# Patient Record
Sex: Female | Born: 1975 | Race: Black or African American | Hispanic: No | Marital: Married | Smoking: Never smoker
Health system: Southern US, Community
[De-identification: ages and names within clinical notes are randomized; demographics above are authoritative.]

## PROBLEM LIST (undated history)

## (undated) DIAGNOSIS — D219 Benign neoplasm of connective and other soft tissue, unspecified: Secondary | ICD-10-CM

## (undated) DIAGNOSIS — D649 Anemia, unspecified: Secondary | ICD-10-CM

## (undated) DIAGNOSIS — Z6841 Body Mass Index (BMI) 40.0 and over, adult: Secondary | ICD-10-CM

## (undated) HISTORY — PX: TUBAL LIGATION: SHX77

---

## 2019-05-25 ENCOUNTER — Other Ambulatory Visit: Payer: Self-pay

## 2019-05-25 ENCOUNTER — Inpatient Hospital Stay (HOSPITAL_COMMUNITY)
Admission: AD | Admit: 2019-05-25 | Discharge: 2019-05-25 | Disposition: A | Discharge status: Home / Self Care | Payer: Self-pay | Attending: Obstetrics and Gynecology | Admitting: Obstetrics and Gynecology

## 2019-05-25 DIAGNOSIS — N912 Amenorrhea, unspecified: Secondary | ICD-10-CM | POA: Insufficient documentation

## 2019-05-25 NOTE — MAU Note (Addendum)
No period since November 2019. Have had 2 home pregnancy tests that are negative." I feel some movement in there and my stomach is getting bigger" Denies vag bleeding, d/c or pain. Just worried about ectopic pregnancy which had about 79yrs ago. Had BTL in 2009. Has hx fibroids and irreg periods but usually does not go longer than couple months without period

## 2019-05-25 NOTE — MAU Note (Addendum)
Maryelizabeth Kaufmann CNM in Triage to talk with pt regarding plan of care. PT then d/c home from triage

## 2019-05-25 NOTE — MAU Provider Note (Signed)
First Provider Initiated Contact with Patient 05/25/19 1924     S Ms. Sophia Mendez is a 43 y.o. non-pregnant female who presents to MAU today requesting pregnancy confirmation. She denies abdominal pain, vaginal bleeding, back pain. She reports LMP of November 2019, not on birth control. Patient states she was seen at [facility] outpatient [facility] about 30 minutes prior to her arrival in MAU today. She had a negative pregnancy test during that visit.   O BP (!) 162/94 (BP Location: Right Arm)   Pulse 86   Temp 98 F (36.7 C)   Resp 18   Ht 5\' 4"  (1.626 m)   Wt (!) 142.9 kg   LMP 11/10/2018   BMI 54.07 kg/m    Physical Exam  Nursing note and vitals reviewed. Constitutional: She is oriented to person, place, and time. She appears well-developed and well-nourished.  Cardiovascular: Normal rate.  Respiratory: Effort normal. No respiratory distress.  Neurological: She is alert and oriented to person, place, and time.  Skin: Skin is warm.  Psychiatric: She has a normal mood and affect. Her behavior is normal. Judgment and thought content normal.   A Non pregnant female Medical screening exam complete Patient advised that MAU does not offer standalone pregnancy confirmation in the absence of concerning ob-related symptoms  P Discharge from MAU in stable condition Patient given the option of transfer to [facility] for further evaluation or seek care in outpatient facility of choice List of options for follow-up given including clinic and urgent care for pregnancy confirmation Patient advised to establish care with GYN provider Patient may return to MAU as needed for pregnancy related complaints  Kieth Brightly 05/25/2019 7:35 PM

## 2020-09-10 ENCOUNTER — Emergency Department (HOSPITAL_COMMUNITY): Payer: HRSA Program

## 2020-09-10 ENCOUNTER — Encounter (HOSPITAL_COMMUNITY): Payer: Self-pay

## 2020-09-10 ENCOUNTER — Other Ambulatory Visit: Payer: Self-pay

## 2020-09-10 ENCOUNTER — Inpatient Hospital Stay (HOSPITAL_COMMUNITY)
Admission: EM | Admit: 2020-09-10 | Discharge: 2020-09-20 | DRG: 177 | Disposition: A | Discharge status: Home / Self Care | Payer: HRSA Program | Attending: Internal Medicine | Admitting: Internal Medicine

## 2020-09-10 DIAGNOSIS — U071 COVID-19: Secondary | ICD-10-CM | POA: Diagnosis present

## 2020-09-10 DIAGNOSIS — E662 Morbid (severe) obesity with alveolar hypoventilation: Secondary | ICD-10-CM | POA: Diagnosis present

## 2020-09-10 DIAGNOSIS — E1165 Type 2 diabetes mellitus with hyperglycemia: Secondary | ICD-10-CM | POA: Diagnosis present

## 2020-09-10 DIAGNOSIS — J1282 Pneumonia due to coronavirus disease 2019: Secondary | ICD-10-CM | POA: Diagnosis present

## 2020-09-10 DIAGNOSIS — T380X5A Adverse effect of glucocorticoids and synthetic analogues, initial encounter: Secondary | ICD-10-CM | POA: Diagnosis not present

## 2020-09-10 DIAGNOSIS — N920 Excessive and frequent menstruation with regular cycle: Secondary | ICD-10-CM | POA: Diagnosis present

## 2020-09-10 DIAGNOSIS — J9601 Acute respiratory failure with hypoxia: Secondary | ICD-10-CM | POA: Diagnosis present

## 2020-09-10 DIAGNOSIS — R739 Hyperglycemia, unspecified: Secondary | ICD-10-CM | POA: Diagnosis not present

## 2020-09-10 DIAGNOSIS — Z6841 Body Mass Index (BMI) 40.0 and over, adult: Secondary | ICD-10-CM

## 2020-09-10 HISTORY — DX: Anemia, unspecified: D64.9

## 2020-09-10 HISTORY — DX: Morbid (severe) obesity due to excess calories: E66.01

## 2020-09-10 HISTORY — DX: Benign neoplasm of connective and other soft tissue, unspecified: D21.9

## 2020-09-10 HISTORY — DX: Body Mass Index (BMI) 40.0 and over, adult: Z684

## 2020-09-10 LAB — FIBRINOGEN: Fibrinogen: 528 mg/dL — ABNORMAL HIGH (ref 210–475)

## 2020-09-10 LAB — CBC WITH DIFFERENTIAL/PLATELET
Abs Immature Granulocytes: 0.09 10*3/uL — ABNORMAL HIGH (ref 0.00–0.07)
Basophils Absolute: 0 10*3/uL (ref 0.0–0.1)
Basophils Relative: 0 %
Eosinophils Absolute: 0 10*3/uL (ref 0.0–0.5)
Eosinophils Relative: 0 %
HCT: 35.6 % — ABNORMAL LOW (ref 36.0–46.0)
Hemoglobin: 10.8 g/dL — ABNORMAL LOW (ref 12.0–15.0)
Immature Granulocytes: 1 %
Lymphocytes Relative: 13 %
Lymphs Abs: 1 10*3/uL (ref 0.7–4.0)
MCH: 29.2 pg (ref 26.0–34.0)
MCHC: 30.3 g/dL (ref 30.0–36.0)
MCV: 96.2 fL (ref 80.0–100.0)
Monocytes Absolute: 0.4 10*3/uL (ref 0.1–1.0)
Monocytes Relative: 5 %
Neutro Abs: 6.1 10*3/uL (ref 1.7–7.7)
Neutrophils Relative %: 81 %
Platelets: 345 10*3/uL (ref 150–400)
RBC: 3.7 MIL/uL — ABNORMAL LOW (ref 3.87–5.11)
RDW: 18.6 % — ABNORMAL HIGH (ref 11.5–15.5)
WBC: 7.6 10*3/uL (ref 4.0–10.5)
nRBC: 1.3 % — ABNORMAL HIGH (ref 0.0–0.2)

## 2020-09-10 LAB — COMPREHENSIVE METABOLIC PANEL
ALT: 23 U/L (ref 0–44)
AST: 41 U/L (ref 15–41)
Albumin: 2.7 g/dL — ABNORMAL LOW (ref 3.5–5.0)
Alkaline Phosphatase: 39 U/L (ref 38–126)
Anion gap: 14 (ref 5–15)
BUN: 18 mg/dL (ref 6–20)
CO2: 29 mmol/L (ref 22–32)
Calcium: 7.9 mg/dL — ABNORMAL LOW (ref 8.9–10.3)
Chloride: 95 mmol/L — ABNORMAL LOW (ref 98–111)
Creatinine, Ser: 0.99 mg/dL (ref 0.44–1.00)
GFR calc Af Amer: >60 mL/min (ref >60)
GFR calc non Af Amer: >60 mL/min (ref >60)
Glucose, Bld: 164 mg/dL — ABNORMAL HIGH (ref 70–99)
Potassium: 3.7 mmol/L (ref 3.5–5.1)
Sodium: 138 mmol/L (ref 135–145)
Total Bilirubin: 0.3 mg/dL (ref 0.3–1.2)
Total Protein: 6.5 g/dL (ref 6.5–8.1)

## 2020-09-10 LAB — D-DIMER, QUANTITATIVE: D-Dimer, Quant: 0.77 ug/mL-FEU — ABNORMAL HIGH (ref 0.00–0.50)

## 2020-09-10 LAB — SARS CORONAVIRUS 2 BY RT PCR (HOSPITAL ORDER, PERFORMED IN ~~LOC~~ HOSPITAL LAB): SARS Coronavirus 2: POSITIVE — AB

## 2020-09-10 LAB — LACTATE DEHYDROGENASE: LDH: 350 U/L — ABNORMAL HIGH (ref 98–192)

## 2020-09-10 LAB — C-REACTIVE PROTEIN: CRP: 7.6 mg/dL — ABNORMAL HIGH (ref <1.0)

## 2020-09-10 LAB — FERRITIN: Ferritin: 47 ng/mL (ref 11–307)

## 2020-09-10 LAB — CBG MONITORING, ED
Glucose-Capillary: 129 mg/dL — ABNORMAL HIGH (ref 70–99)
Glucose-Capillary: 162 mg/dL — ABNORMAL HIGH (ref 70–99)

## 2020-09-10 LAB — PROCALCITONIN: Procalcitonin: <0.1 ng/mL

## 2020-09-10 LAB — HIV ANTIBODY (ROUTINE TESTING W REFLEX): HIV Screen 4th Generation wRfx: NONREACTIVE

## 2020-09-10 LAB — HCG, QUANTITATIVE, PREGNANCY: hCG, Beta Chain, Quant, S: <1 m[IU]/mL (ref <5)

## 2020-09-10 LAB — TRIGLYCERIDES: Triglycerides: 111 mg/dL (ref <150)

## 2020-09-10 LAB — LACTIC ACID, PLASMA: Lactic Acid, Venous: 1.8 mmol/L (ref 0.5–1.9)

## 2020-09-10 MED ORDER — ENOXAPARIN SODIUM 40 MG/0.4ML ~~LOC~~ SOLN: 40.0000 mg | SUBCUTANEOUS | Status: DC

## 2020-09-10 MED ORDER — ACETAMINOPHEN 325 MG PO TABS
650.0000 mg | ORAL_TABLET | Freq: Four times a day (QID) | ORAL | Status: DC | PRN
  Administered 2020-09-10 – 2020-09-20 (×5): 650 mg via ORAL
  Filled 2020-09-10 (×5): qty 2

## 2020-09-10 MED ORDER — SODIUM CHLORIDE 0.9% FLUSH: 3.0000 mL | INTRAVENOUS | Status: DC | PRN

## 2020-09-10 MED ORDER — ALBUTEROL SULFATE HFA 108 (90 BASE) MCG/ACT IN AERS
2.0000 | INHALATION_SPRAY | RESPIRATORY_TRACT | Status: DC | PRN
  Administered 2020-09-10: 2 via RESPIRATORY_TRACT
  Filled 2020-09-10: qty 6.7

## 2020-09-10 MED ORDER — SODIUM CHLORIDE 0.9% FLUSH
3.0000 mL | Freq: Two times a day (BID) | INTRAVENOUS | Status: DC
  Administered 2020-09-10 – 2020-09-20 (×12): 3 mL via INTRAVENOUS

## 2020-09-10 MED ORDER — ZINC SULFATE 220 (50 ZN) MG PO CAPS
220.0000 mg | ORAL_CAPSULE | Freq: Every day | ORAL | Status: DC
  Administered 2020-09-10 – 2020-09-20 (×11): 220 mg via ORAL
  Filled 2020-09-10 (×11): qty 1

## 2020-09-10 MED ORDER — METHYLPREDNISOLONE SODIUM SUCC 125 MG IJ SOLR
125.0000 mg | Freq: Once | INTRAMUSCULAR | Status: AC
  Administered 2020-09-10: 125 mg via INTRAVENOUS
  Filled 2020-09-10: qty 2

## 2020-09-10 MED ORDER — ENOXAPARIN SODIUM 80 MG/0.8ML ~~LOC~~ SOLN
80.0000 mg | SUBCUTANEOUS | Status: DC
  Administered 2020-09-10 – 2020-09-19 (×10): 80 mg via SUBCUTANEOUS
  Filled 2020-09-10 (×10): qty 0.8

## 2020-09-10 MED ORDER — ONDANSETRON HCL 4 MG PO TABS: 4.0000 mg | ORAL_TABLET | Freq: Four times a day (QID) | ORAL | Status: DC | PRN

## 2020-09-10 MED ORDER — SODIUM CHLORIDE 0.9 % IV SOLN: 250.0000 mL | INTRAVENOUS | Status: DC | PRN

## 2020-09-10 MED ORDER — INSULIN ASPART 100 UNIT/ML ~~LOC~~ SOLN
0.0000 [IU] | Freq: Three times a day (TID) | SUBCUTANEOUS | Status: DC
  Administered 2020-09-10: 3 [IU] via SUBCUTANEOUS
  Administered 2020-09-11: 4 [IU] via SUBCUTANEOUS
  Administered 2020-09-11 (×2): 3 [IU] via SUBCUTANEOUS
  Administered 2020-09-12 (×2): 4 [IU] via SUBCUTANEOUS
  Administered 2020-09-13 – 2020-09-15 (×5): 3 [IU] via SUBCUTANEOUS
  Administered 2020-09-15 – 2020-09-16 (×2): 4 [IU] via SUBCUTANEOUS
  Administered 2020-09-16: 3 [IU] via SUBCUTANEOUS
  Administered 2020-09-17 – 2020-09-18 (×3): 4 [IU] via SUBCUTANEOUS
  Administered 2020-09-19: 7 [IU] via SUBCUTANEOUS
  Administered 2020-09-20: 3 [IU] via SUBCUTANEOUS
  Filled 2020-09-10: qty 0.2

## 2020-09-10 MED ORDER — FLEET ENEMA 7-19 GM/118ML RE ENEM: 1.0000 | ENEMA | Freq: Once | RECTAL | Status: DC | PRN

## 2020-09-10 MED ORDER — PREDNISONE 20 MG PO TABS
50.0000 mg | ORAL_TABLET | Freq: Every day | ORAL | Status: DC
  Administered 2020-09-14 – 2020-09-15 (×2): 50 mg via ORAL
  Filled 2020-09-10 (×2): qty 2

## 2020-09-10 MED ORDER — POLYETHYLENE GLYCOL 3350 17 G PO PACK: 17.0000 g | PACK | Freq: Every day | ORAL | Status: DC | PRN

## 2020-09-10 MED ORDER — METHYLPREDNISOLONE SODIUM SUCC 125 MG IJ SOLR
0.5000 mg/kg | Freq: Two times a day (BID) | INTRAMUSCULAR | Status: AC
  Administered 2020-09-10 – 2020-09-13 (×6): 77.5 mg via INTRAVENOUS
  Filled 2020-09-10 (×5): qty 2

## 2020-09-10 MED ORDER — INSULIN ASPART 100 UNIT/ML ~~LOC~~ SOLN
0.0000 [IU] | Freq: Every day | SUBCUTANEOUS | Status: DC
  Administered 2020-09-12: 2 [IU] via SUBCUTANEOUS
  Filled 2020-09-10: qty 0.05

## 2020-09-10 MED ORDER — SODIUM CHLORIDE 0.9% FLUSH
3.0000 mL | Freq: Two times a day (BID) | INTRAVENOUS | Status: DC
  Administered 2020-09-10 – 2020-09-19 (×18): 3 mL via INTRAVENOUS

## 2020-09-10 MED ORDER — ASCORBIC ACID 500 MG PO TABS
500.0000 mg | ORAL_TABLET | Freq: Every day | ORAL | Status: DC
  Administered 2020-09-10 – 2020-09-20 (×11): 500 mg via ORAL
  Filled 2020-09-10 (×11): qty 1

## 2020-09-10 MED ORDER — ALBUTEROL SULFATE HFA 108 (90 BASE) MCG/ACT IN AERS
2.0000 | INHALATION_SPRAY | RESPIRATORY_TRACT | Status: DC | PRN
  Administered 2020-09-11: 2 via RESPIRATORY_TRACT

## 2020-09-10 MED ORDER — SODIUM CHLORIDE 0.9 % IV SOLN
100.0000 mg | Freq: Every day | INTRAVENOUS | Status: AC
  Administered 2020-09-11 – 2020-09-14 (×4): 100 mg via INTRAVENOUS
  Filled 2020-09-10 (×4): qty 20

## 2020-09-10 MED ORDER — OXYCODONE HCL 5 MG PO TABS
5.0000 mg | ORAL_TABLET | ORAL | Status: DC | PRN
  Administered 2020-09-12 – 2020-09-18 (×5): 5 mg via ORAL
  Filled 2020-09-10 (×5): qty 1

## 2020-09-10 MED ORDER — BISACODYL 5 MG PO TBEC: 5.0000 mg | DELAYED_RELEASE_TABLET | Freq: Every day | ORAL | Status: DC | PRN

## 2020-09-10 MED ORDER — SODIUM CHLORIDE 0.9 % IV SOLN
200.0000 mg | Freq: Once | INTRAVENOUS | Status: AC
  Administered 2020-09-10: 200 mg via INTRAVENOUS
  Filled 2020-09-10: qty 200

## 2020-09-10 MED ORDER — ONDANSETRON HCL 4 MG/2ML IJ SOLN: 4.0000 mg | Freq: Four times a day (QID) | INTRAMUSCULAR | Status: DC | PRN

## 2020-09-10 MED ORDER — HYDROCOD POLST-CPM POLST ER 10-8 MG/5ML PO SUER
5.0000 mL | Freq: Two times a day (BID) | ORAL | Status: DC | PRN
  Administered 2020-09-10 – 2020-09-16 (×3): 5 mL via ORAL
  Filled 2020-09-10 (×3): qty 5

## 2020-09-10 MED ORDER — GUAIFENESIN-DM 100-10 MG/5ML PO SYRP
10.0000 mL | ORAL_SOLUTION | ORAL | Status: DC | PRN
  Administered 2020-09-13 (×2): 10 mL via ORAL
  Filled 2020-09-10 (×2): qty 10

## 2020-09-10 NOTE — Progress Notes (Signed)
Called to PT room for desaturation issue. Found PT at 88% on 6 LPM standard nasal cannula. RT placed PT on 6 LPM high flow nasal cannula ([place]). Per MD, Sp02 >=85% at rest and 75%-80% with movement. RN aware.

## 2020-09-10 NOTE — Progress Notes (Signed)
Flutters are on back order at this time.

## 2020-09-10 NOTE — ED Notes (Signed)
Pt placed on the bedpan

## 2020-09-10 NOTE — ED Triage Notes (Signed)
Pt BIB EMS from home. Pt COVID+ 9/7. Pt c/o SOB and cough. EMS gave tylenol 1000 mg for 101.9 temp. Pt reading below 50% RA. Pt on nonrebreather 15L at 85%.  106/60 110 HR 22 Resp

## 2020-09-10 NOTE — H&P (Signed)
History and Physical    Sophia Mendez:[id number] DOB: 1976-05-27 DOA: 09/10/2020  PCP: Sophia Turner, DO Consultants:  None Patient coming from:  Home - lives with husband and 2 children; NOK:  Sophia Mendez, [phone]  Chief Complaint: Worsening COVID symptoms  HPI: Sophia Mendez is a 44 y.o. female with medical history significant of chronic back and neck pain; and morbid obesity presenting with SOB and cough.  She reports that her husband got sick and now everyone in their house has COVID.  She has had n/v/d, loss of taste/smell, cough, and SOB.  Symptoms worsened 2-3 days ago.  Initial O2 sat with EMS was <50%.  She is currently on HFNC at 6L + NRB.   ED Course: COVID + on 9/7.  Sats in 40s with EMS.  On HFNC and NRB.  Ordered Remdesivir and Solumedrol.  Review of Systems: As per HPI; otherwise review of systems reviewed and negative.   Ambulatory Status: Ambulates without assistance  COVID Vaccine Status:  None  Past Medical History:  Diagnosis Date  . Anemia   . Fibroids   . Morbid obesity with BMI of 50.0-59.9, adult [facility])     Past Surgical History:  Procedure Laterality Date  . TUBAL LIGATION      Social History   Socioeconomic History  . Marital status: Married    Spouse name: Not on file  . Number of children: Not on file  . Years of education: Not on file  . Highest education level: Not on file  Occupational History  . Not on file  Tobacco Use  . Smoking status: Never Smoker  . Smokeless tobacco: Never Used  Vaping Use  . Vaping Use: Never used  Substance and Sexual Activity  . Alcohol use: Never  . Drug use: Never  . Sexual activity: Not on file  Other Topics Concern  . Not on file  Social History Narrative  . Not on file   Social Determinants of Health   Financial Resource Strain:   . Difficulty of Paying Living Expenses: Not on file  Food Insecurity:   . Worried About Charity fundraiser in the Last Year: Not on file  . Ran Out of  Food in the Last Year: Not on file  Transportation Needs:   . Lack of Transportation (Medical): Not on file  . Lack of Transportation (Non-Medical): Not on file  Physical Activity:   . Days of Exercise per Week: Not on file  . Minutes of Exercise per Session: Not on file  Stress:   . Feeling of Stress : Not on file  Social Connections:   . Frequency of Communication with Friends and Family: Not on file  . Frequency of Social Gatherings with Friends and Family: Not on file  . Attends Religious Services: Not on file  . Active Member of Clubs or Organizations: Not on file  . Attends Archivist Meetings: Not on file  . Marital Status: Not on file  Intimate Partner Violence:   . Fear of Current or Ex-Partner: Not on file  . Emotionally Abused: Not on file  . Physically Abused: Not on file  . Sexually Abused: Not on file    No Known Allergies  History reviewed. No pertinent family history.  Prior to Admission medications   Medication Sig Start Date End Date Taking? Authorizing Provider  acetaminophen (TYLENOL) 500 MG tablet Take 1,000 mg by mouth every 6 (six) hours as needed.   Yes [provider]  ibuprofen (ADVIL) 200 MG tablet Take 400-600 mg by mouth every 6 (six) hours as needed.   Yes [provider]    Physical Exam: Vitals:   09/10/20 1507 09/10/20 1530 09/10/20 1538 09/10/20 1639  BP: 119/82 133/71  130/72  Pulse: 86 90  92  Resp: (!) 24 (!) 27  (!) 22  Temp:      TempSrc:      SpO2: (!) 88% 92%  93%  Weight:   (!) 154.5 kg   Height:   5\' 4"  (1.626 m)      . General:  Appears ill and uncomfortable, wearing very little clothing and struggling to speak much . Eyes:  PERRL, EOMI, normal lids, iris . ENT:  grossly normal hearing, lips & tongue, mmm . Neck:  no LAD, masses or thyromegaly . Cardiovascular:  RRR, no m/r/g. No LE edema.  Marland Kitchen Respiratory:   Mild diffuse rhonchi.  Mildly increased respiratory effort despite HFNC . Abdomen:   Morbidly obese, mildly diffusely tender . Skin:  no rash or induration seen on limited exam . Musculoskeletal:  grossly normal tone BUE/BLE, good ROM, no bony abnormality . Psychiatric:  blunted mood and affect, speech fluent and appropriate, AOx3 . Neurologic:  CN 2-12 grossly intact, moves all extremities in coordinated fashion    Radiological Exams on Admission: DG Chest Port 1 View  Result Date: 09/10/2020 CLINICAL DATA:  COVID-19, shortness of breath. EXAM: PORTABLE CHEST 1 VIEW COMPARISON:  November 29, 2015. FINDINGS: Stable cardiomegaly. No pneumothorax is noted. Increased bilateral perihilar and basilar opacities are noted concerning for pneumonia. No definite pleural effusion is noted. Bony thorax is unremarkable. IMPRESSION: Increased bilateral perihilar and basilar opacities are noted concerning for pneumonia. Electronically Signed   By: Marijo Conception M.D.   On: 09/10/2020 13:17    EKG: Independently reviewed.  NSR with rate 98; nonspecific ST changes with no evidence of acute ischemia   Labs on Admission: I have personally reviewed the available labs and imaging studies at the time of the admission.  Pertinent labs:   Glucose 164 Albumin 2.7 LDH 350 Ferritin 47 CRP 7.6 Lactate 1.8 Procalcitonin <0.10 WBC 7.6 Hgb 10.8 D-dimer 0.77 Fibrinogen 528 COVID POSITIVE   Assessment/Plan Active Problems:   Acute hypoxemic respiratory failure due to COVID-19 [facility])   Morbid obesity with BMI of 50.0-59.9, adult (HCC)   Acute respiratory failure with hypoxia due to COVID-19 PNA -Patient with presenting with SOB and GI symptoms in the setting of known COVID infection -She does not have a usual home O2 requirement and is currently requiring 6L HFNC O2 + NRB; initial O2 sats with EMS were <50% -COVID POSITIVE -The patient has comorbidities which may increase the risk for ARDS/MODS including: obesity -Exam is concerning for development of ARDS/MODS due to respiratory  distress, -Pertinent labs concerning for COVID include normal WBC count;  increased LDH; elevated D-dimer (not >1); low procalcitonin; markedly elevated CRP (>7); increased fibrinogen -CXR with multifocal opacities which may be c/w COVID vs. Multifocal PNA -Will not treat with broad-spectrum antibiotics given procalcitonin <0.5 -Will admit for further evaluation, close monitoring, and treatment -Monitor on telemetry x at least 24 hours -At this time, will attempt to avoid use of aerosolized medications and use HFAs instead -Will check daily labs including BMP with Mag, Phos; LFTs; CBC with differential; CRP; ferritin; fibrinogen; D-dimer -Will order steroids and Remdesivir (pharmacy consult) given +COVID test, +CXR, and hypoxia <94% on room air -If the patient shows clinical deterioration,  consider transfer to ICU with PCCM consultation -Consider Tocilizumab and/or convalescent plasma if the patient does not stabilize on current treatment or if the patient has marked clinical decompensation; the patient does not appear to require these treatments at this time. -Will attempt to maintain euvolemia to a net negative fluid status -Will ask the patient to maintain an awake prone position for 16+ hours a day, if possible, with a minimum of 2-3 hours at a time -With D-dimer <5, will use standard-dosed Lovenox for DVT prevention -Patient was seen wearing full PPE including: gown, gloves, head cover, N95, and face shield; donning and doffing was in compliance with current standards.  Obesity -Body mass index is 58.47 kg/m. -Weight loss should be encouraged -Outpatient PCP/bariatric medicine/bariatric surgery f/u encouraged     DVT prophylaxis:  Lovenox  Code Status:  Full - confirmed with patient Family Communication: None present; I spoke with the patient's husband by telephone. Disposition Plan:  The patient is from: home  Anticipated d/c is to: home without [facility] services once her respiratory  issues have been resolved.  She may require home O2 at the time of discharge.  Anticipated d/c date will depend on clinical response to treatment, likely between 3 days (with completion of outpatient Remdesivir treatment) and 5 days  Patient is currently: acutely ill Consults called: None  Admission status: Admit - It is my clinical opinion that admission to INPATIENT is reasonable and necessary because of the expectation that this patient will require hospital care that crosses at least 2 midnights to treat this condition based on the medical complexity of the problems presented.  Given the aforementioned information, the predictability of an adverse outcome is felt to be significant.      Karmen Bongo MD [facility]   How to contact the [facility] Attending or Consulting provider [place] or covering provider during after hours [place], for this patient?  1. Check the care team in [facility] and look for a) attending/consulting TR[facility] provider listed and b) the [facility] team listed 2. Log into [email or web address] and use [place]'s universal password to access. If you do not have the password, please contact the hospital operator. 3. Locate the [facility] provider you are looking for under [facility] and page to a number that you can be directly reached. 4. If you still have difficulty reaching the provider, please page the [facility] (Director on Call) for the Hospitalists listed on amion for assistance.   09/10/2020, 5:05 PM

## 2020-09-10 NOTE — ED Provider Notes (Signed)
[facility] DEPT Provider Note   CSN: [id number] Arrival date & time: 09/10/20  1151     History Chief Complaint  Patient presents with  . Covid Positive  . Shortness of Breath  . Cough    Sophia Mendez is a 44 y.o. female.  44 year old female no significant past medical history except morbid obesity here with Covid-like symptoms for 1 week.  Tested +7 days ago.  Complaining of worsening shortness of breath cough productive of some red sputum headache fevers diarrhea.  EMS found to be febrile at 1019 and gave her Tylenol.  Sats were 50% on room air.  Placed on nonrebreather.  She states she feels better on oxygen.  Has not been vaccinated.  Was waiting for her children to turn 12 so they could not get vaccinated together.  The history is provided by the patient.  Shortness of Breath Severity:  Moderate Onset quality:  Gradual Duration:  1 week Timing:  Intermittent Progression:  Worsening Chronicity:  New Relieved by:  Nothing Worsened by:  Activity and coughing Ineffective treatments:  Rest Associated symptoms: cough, fever, headaches, hemoptysis and sputum production   Associated symptoms: no abdominal pain, no chest pain, no rash, no sore throat and no vomiting   Risk factors: no tobacco use   Cough Associated symptoms: fever, headaches, myalgias and shortness of breath   Associated symptoms: no chest pain, no rash and no sore throat        History reviewed. No pertinent past medical history.  There are no problems to display for this patient.   History reviewed. No pertinent surgical history.   OB History   No obstetric history on file.     History reviewed. No pertinent family history.  Social History   Tobacco Use  . Smoking status: Not on file  Substance Use Topics  . Alcohol use: Not on file  . Drug use: Not on file    Home Medications Prior to Admission medications   Not on File    Allergies    Patient has no  known allergies.  Review of Systems   Review of Systems  Constitutional: Positive for fever.  HENT: Negative for sore throat.   Eyes: Negative for visual disturbance.  Respiratory: Positive for cough, hemoptysis, sputum production and shortness of breath.   Cardiovascular: Negative for chest pain.  Gastrointestinal: Positive for diarrhea. Negative for abdominal pain and vomiting.  Genitourinary: Negative for dysuria.  Musculoskeletal: Positive for myalgias.  Skin: Negative for rash.  Neurological: Positive for headaches.    Physical Exam Updated Vital Signs BP (!) 113/45 (BP Location: Left Arm)   Pulse 100   Temp (!) 101.2 F (38.4 C) (Oral)   Resp 17   SpO2 90%   Physical Exam Vitals and nursing note reviewed.  Constitutional:      General: She is in acute distress.     Appearance: She is well-developed. She is obese.  HENT:     Head: Normocephalic and atraumatic.  Eyes:     Conjunctiva/sclera: Conjunctivae normal.  Cardiovascular:     Rate and Rhythm: Regular rhythm. Tachycardia present.     Heart sounds: No murmur heard.   Pulmonary:     Effort: Tachypnea and accessory muscle usage present. No respiratory distress.     Breath sounds: Wheezing and rhonchi present.  Abdominal:     Palpations: Abdomen is soft.     Tenderness: There is no abdominal tenderness. There is no guarding or rebound.  Musculoskeletal:     Cervical back: Neck supple.     Right lower leg: No tenderness.     Left lower leg: No tenderness.  Skin:    General: Skin is warm and dry.     Capillary Refill: Capillary refill takes less than 2 seconds.  Neurological:     General: No focal deficit present.     Mental Status: She is alert.     ED Results / Procedures / Treatments   Labs (all labs ordered are listed, but only abnormal results are displayed) Labs Reviewed  SARS CORONAVIRUS 2 BY RT PCR ([place] [place] [facility]) - Abnormal; Notable for the following  components:      Result Value   SARS Coronavirus 2 POSITIVE (*)    All other components within normal limits  CBC WITH DIFFERENTIAL/PLATELET - Abnormal; Notable for the following components:   RBC 3.70 (*)    Hemoglobin 10.8 (*)    HCT 35.6 (*)    RDW 18.6 (*)    nRBC 1.3 (*)    Abs Immature Granulocytes 0.09 (*)    All other components within normal limits  COMPREHENSIVE METABOLIC PANEL - Abnormal; Notable for the following components:   Chloride 95 (*)    Glucose, Bld 164 (*)    Calcium 7.9 (*)    Albumin 2.7 (*)    All other components within normal limits  D-DIMER, QUANTITATIVE (NOT AT [facility]) - Abnormal; Notable for the following components:   D-Dimer, Quant 0.77 (*)    All other components within normal limits  LACTATE DEHYDROGENASE - Abnormal; Notable for the following components:   LDH 350 (*)    All other components within normal limits  FIBRINOGEN - Abnormal; Notable for the following components:   Fibrinogen 528 (*)    All other components within normal limits  C-REACTIVE PROTEIN - Abnormal; Notable for the following components:   CRP 7.6 (*)    All other components within normal limits  CBG MONITORING, ED - Abnormal; Notable for the following components:   Glucose-Capillary 129 (*)    All other components within normal limits  CULTURE, BLOOD (ROUTINE X 2)  CULTURE, BLOOD (ROUTINE X 2)  LACTIC ACID, PLASMA  PROCALCITONIN  FERRITIN  TRIGLYCERIDES  HCG, QUANTITATIVE, PREGNANCY  HIV ANTIBODY (ROUTINE TESTING W REFLEX)  CBC WITH DIFFERENTIAL/PLATELET  COMPREHENSIVE METABOLIC PANEL  C-REACTIVE PROTEIN  D-DIMER, QUANTITATIVE (NOT AT [facility])  FERRITIN  MAGNESIUM  PHOSPHORUS    EKG EKG Interpretation  Date/Time:  Tuesday September 10 2020 12:06:04 EDT Ventricular Rate:  98 PR Interval:    QRS Duration: 84 QT Interval:  368 QTC Calculation: 470 R Axis:   49 Text Interpretation: Sinus rhythm Borderline short PR interval Probable left atrial enlargement  Nonspecific repol abnormality, lateral leads No old tracing to compare Confirmed by Aletta Edouard [phone]) on 09/10/2020 12:17:32 PM   Radiology DG Chest Port 1 View  Result Date: 09/10/2020 CLINICAL DATA:  COVID-19, shortness of breath. EXAM: PORTABLE CHEST 1 VIEW COMPARISON:  November 29, 2015. FINDINGS: Stable cardiomegaly. No pneumothorax is noted. Increased bilateral perihilar and basilar opacities are noted concerning for pneumonia. No definite pleural effusion is noted. Bony thorax is unremarkable. IMPRESSION: Increased bilateral perihilar and basilar opacities are noted concerning for pneumonia. Electronically Signed   By: Marijo Conception M.D.   On: 09/10/2020 13:17    Procedures .Critical Care Performed by: Hayden Rasmussen, MD Authorized by: Hayden Rasmussen, MD   Critical  care provider statement:    Critical care time (minutes):  45   Critical care time was exclusive of:  Separately billable procedures and treating other patients   Critical care was necessary to treat or prevent imminent or life-threatening deterioration of the following conditions:  Respiratory failure   Critical care was time spent personally by me on the following activities:  Discussions with consultants, evaluation of patient's response to treatment, examination of patient, ordering and performing treatments and interventions, ordering and review of laboratory studies, ordering and review of radiographic studies, pulse oximetry, re-evaluation of patient's condition, obtaining history from patient or surrogate, review of old charts and development of treatment plan with patient or surrogate   I assumed direction of critical care for this patient from another provider in my specialty: no     (including critical care time)  Medications Ordered in ED Medications  methylPREDNISolone sodium succinate (SOLU-MEDROL) 125 mg/2 mL injection 125 mg (has no administration in time range)  albuterol (VENTOLIN HFA) 108 (90  Base) MCG/ACT inhaler 2 puff (has no administration in time range)    ED Course  I have reviewed the triage vital signs and the nursing notes.  Pertinent labs & imaging results that were available during my care of the patient were reviewed by me and considered in my medical decision making (see chart for details).  Clinical Course as of Sep 10 1733  Tue Sep 10, 2020  1215 Discussed with Dr. Lorin Mercy [facility] who will evaluate the patient for admission.   [MB]  0762 Chest x-ray interpreted by me as multifocal pneumonia.   [MB]    Clinical Course User Index [MB] Hayden Rasmussen, MD   MDM Rules/Calculators/A&P                         This patient complains of increased shortness of breath in the setting of recent Covid diagnosis.; this involves an extensive number of treatment Options and is a complaint that carries with it a high risk of complications and Morbidity. The differential includes hypoxia, pneumonia, Covid, ARDS, anemia, ACS  I ordered, reviewed and interpreted labs, which included Covid testing positive, CBC with normal white count low hemoglobin unclear baseline, chemistries fairly unremarkable other than elevated glucose, inflammatory markers elevated, lactic acid normal I ordered medication IV steroids IV remdesivir for treatment of Covid and hypoxia I ordered imaging studies which included chest x-ray and I independently    visualized and interpreted imaging which showed multifocal pneumonia Additional history obtained from EMS Previous records obtained and reviewed in epic, no prior visits I consulted [facility] hospitalist Dr. Lorin Mercy and discussed lab and imaging findings  Critical Interventions: Work-up of hypoxia in the setting of Covid with initiation of high flow oxygen, steroids, remdesivir  After the interventions stated above, I reevaluated the patient and found patient still to be mildly tachypneic and requiring elevated levels of oxygen.  Will need  admission to the hospital for further management of her Covid symptoms.  Sophia Mendez was evaluated in Emergency Department on 09/10/2020 for the symptoms described in the history of present illness. She was evaluated in the context of the global COVID-19 pandemic, which necessitated consideration that the patient might be at risk for infection with the SARS-CoV-2 virus that causes COVID-19. Institutional protocols and algorithms that pertain to the evaluation of patients at risk for COVID-19 are in a state of rapid change based on information released by regulatory bodies including the CDC  and federal and state organizations. These policies and algorithms were followed during the patient's care in the ED.   Final Clinical Impression(s) / ED Diagnoses Final diagnoses:  Acute respiratory failure with hypoxia ([place])  Pneumonia due to COVID-19 virus    Rx / DC Orders ED Discharge Orders    None       Hayden Rasmussen, MD 09/10/20 [phone]

## 2020-09-11 LAB — COMPREHENSIVE METABOLIC PANEL
ALT: 30 U/L (ref 0–44)
AST: 66 U/L — ABNORMAL HIGH (ref 15–41)
Albumin: 3 g/dL — ABNORMAL LOW (ref 3.5–5.0)
Alkaline Phosphatase: 48 U/L (ref 38–126)
Anion gap: 12 (ref 5–15)
BUN: 24 mg/dL — ABNORMAL HIGH (ref 6–20)
CO2: 31 mmol/L (ref 22–32)
Calcium: 8 mg/dL — ABNORMAL LOW (ref 8.9–10.3)
Chloride: 94 mmol/L — ABNORMAL LOW (ref 98–111)
Creatinine, Ser: 1 mg/dL (ref 0.44–1.00)
GFR calc Af Amer: >60 mL/min (ref >60)
GFR calc non Af Amer: >60 mL/min (ref >60)
Glucose, Bld: 161 mg/dL — ABNORMAL HIGH (ref 70–99)
Potassium: 5 mmol/L (ref 3.5–5.1)
Sodium: 137 mmol/L (ref 135–145)
Total Bilirubin: 0.5 mg/dL (ref 0.3–1.2)
Total Protein: 7.3 g/dL (ref 6.5–8.1)

## 2020-09-11 LAB — CBC WITH DIFFERENTIAL/PLATELET
Abs Immature Granulocytes: 0.17 10*3/uL — ABNORMAL HIGH (ref 0.00–0.07)
Basophils Absolute: 0 10*3/uL (ref 0.0–0.1)
Basophils Relative: 0 %
Eosinophils Absolute: 0 10*3/uL (ref 0.0–0.5)
Eosinophils Relative: 0 %
HCT: 39.6 % (ref 36.0–46.0)
Hemoglobin: 11.7 g/dL — ABNORMAL LOW (ref 12.0–15.0)
Immature Granulocytes: 1 %
Lymphocytes Relative: 12 %
Lymphs Abs: 1.5 10*3/uL (ref 0.7–4.0)
MCH: 29.4 pg (ref 26.0–34.0)
MCHC: 29.5 g/dL — ABNORMAL LOW (ref 30.0–36.0)
MCV: 99.5 fL (ref 80.0–100.0)
Monocytes Absolute: 0.6 10*3/uL (ref 0.1–1.0)
Monocytes Relative: 5 %
Neutro Abs: 9.8 10*3/uL — ABNORMAL HIGH (ref 1.7–7.7)
Neutrophils Relative %: 82 %
Platelets: 352 10*3/uL (ref 150–400)
RBC: 3.98 MIL/uL (ref 3.87–5.11)
RDW: 18.6 % — ABNORMAL HIGH (ref 11.5–15.5)
WBC: 12.1 10*3/uL — ABNORMAL HIGH (ref 4.0–10.5)
nRBC: 1.5 % — ABNORMAL HIGH (ref 0.0–0.2)

## 2020-09-11 LAB — BLOOD CULTURE ID PANEL (REFLEXED) - BCID2

## 2020-09-11 LAB — CBG MONITORING, ED
Glucose-Capillary: 144 mg/dL — ABNORMAL HIGH (ref 70–99)
Glucose-Capillary: 162 mg/dL — ABNORMAL HIGH (ref 70–99)
Glucose-Capillary: 132 mg/dL — ABNORMAL HIGH (ref 70–99)
Glucose-Capillary: 149 mg/dL — ABNORMAL HIGH (ref 70–99)

## 2020-09-11 LAB — FERRITIN: Ferritin: 46 ng/mL (ref 11–307)

## 2020-09-11 LAB — C-REACTIVE PROTEIN: CRP: 10.8 mg/dL — ABNORMAL HIGH (ref <1.0)

## 2020-09-11 LAB — D-DIMER, QUANTITATIVE: D-Dimer, Quant: 1.12 ug/mL-FEU — ABNORMAL HIGH (ref 0.00–0.50)

## 2020-09-11 LAB — MAGNESIUM: Magnesium: 2.4 mg/dL (ref 1.7–2.4)

## 2020-09-11 LAB — PHOSPHORUS: Phosphorus: 4.9 mg/dL — ABNORMAL HIGH (ref 2.5–4.6)

## 2020-09-11 MED ORDER — BARICITINIB 2 MG PO TABS
4.0000 mg | ORAL_TABLET | Freq: Every day | ORAL | Status: DC
  Administered 2020-09-11 – 2020-09-20 (×10): 4 mg via ORAL
  Filled 2020-09-11 (×10): qty 2

## 2020-09-11 NOTE — ED Notes (Signed)
Pt given an incentive spirometer per MD request. Pt given instructions on how to use spirometer.  Pt adequately demonstrated ability to use spirometer.  Pt instructed to practice every how while awake. Pt verbalized understanding.

## 2020-09-11 NOTE — ED Notes (Signed)
Assumed care of patient at this time, nad noted, sr up x2, bed locked and low, call bell w/I reach.  Will continue to monitor. ° °

## 2020-09-11 NOTE — ED Notes (Signed)
Reinforced to pt the importance of keeping HFNC on.  Pt stated that she didn't take it off.  Pt states it came off from moving around but expressed understanding of need to keep O2 on.

## 2020-09-11 NOTE — ED Notes (Addendum)
Pt stats dropped to lower 60s due to taking off her oxygen. Pt was advised to keep her oxygen on due to stats dropping by myself and RN Kerin Ransom).

## 2020-09-11 NOTE — Progress Notes (Signed)
PHARMACY - PHYSICIAN COMMUNICATION CRITICAL VALUE ALERT - BLOOD CULTURE IDENTIFICATION (BCID)  Sophia Mendez is an 44 y.o. female who presented to [facility] on 09/10/2020 with a chief complaint of worsening COVID PNA.  Assessment:  44 yo morbidly obese F who presented to ED with worsening COVID symptomson appropriate treatment for that per current [place] COVID guidelines.  Aerobic bottle of 1 set now growing [facility]- BCID+ Staph species (no resistance).  This is likely contaminant.    Name of physician (or Provider) Contacted: Ghimire  Current antibiotics: none  Changes to prescribed antibiotics recommended:  No antibiotics recommended. F/U cx data, patient progress.  Results for orders placed or performed during the hospital encounter of 09/10/20  Blood Culture ID Panel (Reflexed) (Collected: 09/10/2020 12:25 PM)  Result Value Ref Range   Enterococcus faecalis NOT DETECTED NOT DETECTED   Enterococcus Faecium NOT DETECTED NOT DETECTED   Listeria monocytogenes NOT DETECTED NOT DETECTED   Staphylococcus species DETECTED (A) NOT DETECTED   Staphylococcus aureus (BCID) NOT DETECTED NOT DETECTED   Staphylococcus epidermidis NOT DETECTED NOT DETECTED   Staphylococcus lugdunensis NOT DETECTED NOT DETECTED   Streptococcus species NOT DETECTED NOT DETECTED   Streptococcus agalactiae NOT DETECTED NOT DETECTED   Streptococcus pneumoniae NOT DETECTED NOT DETECTED   Streptococcus pyogenes NOT DETECTED NOT DETECTED   A.calcoaceticus-baumannii NOT DETECTED NOT DETECTED   Bacteroides fragilis NOT DETECTED NOT DETECTED   Enterobacterales NOT DETECTED NOT DETECTED   Enterobacter cloacae complex NOT DETECTED NOT DETECTED   Escherichia coli NOT DETECTED NOT DETECTED   Klebsiella aerogenes NOT DETECTED NOT DETECTED   Klebsiella oxytoca NOT DETECTED NOT DETECTED   Klebsiella pneumoniae NOT DETECTED NOT DETECTED   Proteus species NOT DETECTED NOT DETECTED   Salmonella species NOT DETECTED NOT  DETECTED   Serratia marcescens NOT DETECTED NOT DETECTED   Haemophilus influenzae NOT DETECTED NOT DETECTED   Neisseria meningitidis NOT DETECTED NOT DETECTED   Pseudomonas aeruginosa NOT DETECTED NOT DETECTED   Stenotrophomonas maltophilia NOT DETECTED NOT DETECTED   Candida albicans NOT DETECTED NOT DETECTED   Candida auris NOT DETECTED NOT DETECTED   Candida glabrata NOT DETECTED NOT DETECTED   Candida krusei NOT DETECTED NOT DETECTED   Candida parapsilosis NOT DETECTED NOT DETECTED   Candida tropicalis NOT DETECTED NOT DETECTED   Cryptococcus neoformans/gattii NOT DETECTED NOT DETECTED    Netta Cedars PharmD 09/11/2020  4:16 PM

## 2020-09-11 NOTE — Progress Notes (Signed)
PROGRESS NOTE    Sophia Mendez  QAS:[id number] DOB: 10-01-1976 DOA: 09/10/2020 PCP: Pcp, No    Brief Narrative:  44 year old female with history of chronic back and neck pain, morbid obesity admitted with shortness of breath and cough.  Symptoms started about 1 week, worse for 2 to 3 days.  In the emergency room COVID-19 positive on 9/7.  Saturations 50%.  Started on high flow nasal cannula and nonrebreather. Patient probably has underlying untreated sleep apnea.  She also has fibroid and bleeding from fibroid and has been looking forward for surgery. Patient is not vaccinated against COVID-19.   Assessment & Plan:   Active Problems:   Acute hypoxemic respiratory failure due to COVID-19 [facility])   Morbid obesity with BMI of 50.0-59.9, adult (HCC)  Acute hypoxemic respiratory failure due to COVID-19 viral infection: Bilateral pneumonia due to COVID-19: Continue to monitor due to significant symptoms  chest physiotherapy, incentive spirometry, deep breathing exercises, sputum induction, mucolytic's and bronchodilators. Supplemental oxygen to keep saturations more than 90%. Covid directed therapy with , steroids, Solu-Medrol remdesivir, day 2/5 Baricitinib, I discussed in details with patient about use of baricitinib, good results in combination with remdesivir in a patient requiring more oxygen and recent trials as well as emergency use authorization by FDA.  Some side effects could be anticipated including bacterial infection.  Patient has no obvious contraindication to use baricitinib.  Patient has agreed.  We will start patient on 4 mg daily. Due to severity of symptoms, patient will need daily inflammatory markers, chest x-rays, liver function test to monitor and direct COVID-19 therapies.  COVID-19 Labs  Recent Labs    09/10/20 1209 09/11/20 0432 09/11/20 0451  DDIMER 0.77*  --  1.12*  FERRITIN 47 46  --   LDH 350*  --   --   CRP 7.6* 10.8*  --     Lab Results  Component  Value Date   SARSCOV2NAA POSITIVE (A) 09/10/2020    SpO2: 93 % O2 Flow Rate (L/min): 13 L/min  Morbid obesity: BMI more than 50.  Patient will definitely benefit with weight loss surgery.  DVT prophylaxis: Lovenox.   Code Status: Full code Family Communication: Patient's husband and daughter on the phone Disposition Plan: Status is: Inpatient  Remains inpatient appropriate because:Inpatient level of care appropriate due to severity of illness   Dispo: The patient is from: Home              Anticipated d/c is to: Home              Anticipated d/c date is: > 3 days              Patient currently is not medically stable to d/c.         Consultants:   None  Procedures:   None  Antimicrobials:  Antibiotics Given (last 72 hours)    Date/Time Action Medication Dose Rate   09/10/20 1653 New Bag/Given   remdesivir 200 mg in sodium chloride 0.9% 250 mL IVPB 200 mg 580 mL/hr   09/11/20 1120 New Bag/Given   remdesivir 100 mg in sodium chloride 0.9 % 100 mL IVPB 100 mg 200 mL/hr         Subjective: Patient was seen and examined.  Overnight difficulty breathing, difficulty take deep breathing.  Has dry cough.  No sputum production.  He still in the ER waiting for inpatient bed.  Remains afebrile.  Objective: Vitals:   09/11/20 1245 09/11/20 1404 09/11/20 1432 09/11/20  1509  BP: 134/75 117/65 121/66 116/67  Pulse: 78 79 77 77  Resp: (!) 27 (!) 23 (!) 25 (!) 29  Temp:      TempSrc:      SpO2: 91% 92% 95% 93%  Weight:      Height:        Intake/Output Summary (Last 24 hours) at 09/11/2020 1521 Last data filed at 09/10/2020 2229 Gross per 24 hour  Intake 256 ml  Output --  Net 256 ml   Filed Weights   09/10/20 1538  Weight: (!) 154.5 kg    Examination:  General exam: Appears in mild respiratory distress.  With shortness of breath. Respiratory system: Poor bilateral air entry.  No added sounds.  Difficult to auscultate. Cardiovascular system: S1 & S2  heard, RRR. No JVD, murmurs, rubs, gallops or clicks. No pedal edema. Gastrointestinal system: Distended but not tender.  Obese and pendulous. Central nervous system: Alert and oriented. No focal neurological deficits. Extremities: Symmetric 5 x 5 power. Skin: No rashes, lesions or ulcers Psychiatry: Judgement and insight appear normal. Mood & affect appropriate.     Data Reviewed: I have personally reviewed following labs and imaging studies  CBC: Recent Labs  Lab 09/10/20 1209 09/11/20 0451  WBC 7.6 12.1*  NEUTROABS 6.1 9.8*  HGB 10.8* 11.7*  HCT 35.6* 39.6  MCV 96.2 99.5  PLT 345 836   Basic Metabolic Panel: Recent Labs  Lab 09/10/20 1209 09/11/20 0432  NA 138 137  K 3.7 5.0  CL 95* 94*  CO2 29 31  GLUCOSE 164* 161*  BUN 18 24*  CREATININE 0.99 1.00  CALCIUM 7.9* 8.0*  MG  --  2.4  PHOS  --  4.9*   GFR: Estimated Creatinine Clearance: 107.2 mL/min (by C-G formula based on SCr of 1 mg/dL). Liver Function Tests: Recent Labs  Lab 09/10/20 1209 09/11/20 0432  AST 41 66*  ALT 23 30  ALKPHOS 39 48  BILITOT 0.3 0.5  PROT 6.5 7.3  ALBUMIN 2.7* 3.0*   No results for input(s): LIPASE, AMYLASE in the last 168 hours. No results for input(s): AMMONIA in the last 168 hours. Coagulation Profile: No results for input(s): INR, PROTIME in the last 168 hours. Cardiac Enzymes: No results for input(s): CKTOTAL, CKMB, CKMBINDEX, TROPONINI in the last 168 hours. BNP (last 3 results) No results for input(s): PROBNP in the last 8760 hours. HbA1C: No results for input(s): HGBA1C in the last 72 hours. CBG: Recent Labs  Lab 09/10/20 1706 09/10/20 2106 09/11/20 0738 09/11/20 1151  GLUCAP 129* 162* 144* 162*   Lipid Profile: Recent Labs    09/10/20 1209  TRIG 111   Thyroid Function Tests: No results for input(s): TSH, T4TOTAL, FREET4, T3FREE, THYROIDAB in the last 72 hours. Anemia Panel: Recent Labs    09/10/20 1209 09/11/20 0432  FERRITIN 47 46   Sepsis  Labs: Recent Labs  Lab 09/10/20 1209  PROCALCITON <0.10  LATICACIDVEN 1.8    Recent Results (from the past 240 hour(s))  Blood Culture (routine x 2)     Status: None (Preliminary result)   Collection Time: 09/10/20 12:09 PM   Specimen: BLOOD  Result Value Ref Range Status   Specimen Description   Final    BLOOD LEFT ANTECUBITAL Performed at [facility], [place] [address], [place], [place] [address]    Special Requests   Final    BOTTLES DRAWN AEROBIC AND ANAEROBIC Blood Culture adequate volume Performed at [facility],  [place] [address] [place], [place] [address]    Culture   Final    NO GROWTH 1 DAY Performed at [facility], [place] [address], [place], [place] [address]    Report Status PENDING  Incomplete  Blood Culture (routine x 2)     Status: None (Preliminary result)   Collection Time: 09/10/20 12:25 PM   Specimen: BLOOD  Result Value Ref Range Status   Specimen Description   Final    BLOOD RIGHT ANTECUBITAL Performed at [place] [address] [place], [place] [address]    Special Requests   Final    BOTTLES DRAWN AEROBIC AND ANAEROBIC Blood Culture adequate volume Performed at [place] [address], [place], [place] [address]    Culture  Setup Time   Final    GRAM POSITIVE COCCI IN CLUSTERS AEROBIC BOTTLE ONLY Organism ID to follow    Culture   Final    NO GROWTH 1 DAY Performed at [facility], [place] [address], [place], [place] [address]    Report Status PENDING  Incomplete  SARS Coronavirus 2 by RT PCR (hospital order, performed in [facility] hospital lab) Nasopharyngeal Nasopharyngeal Swab     Status: Abnormal   Collection Time: 09/10/20  2:40 PM   Specimen: Nasopharyngeal Swab  Result Value Ref Range Status   SARS Coronavirus 2 POSITIVE (A) NEGATIVE Final    Comment: RESULT CALLED TO, READ BACK BY AND VERIFIED WITH: WEST,S. RN @1638  ON 09.14.2021 BY  COHEN,K (NOTE) SARS-CoV-2 target nucleic acids are DETECTED  SARS-CoV-2 RNA is generally detectable in upper respiratory specimens  during the acute phase of infection.  Positive results are indicative  of the presence of the identified virus, but do not rule out bacterial infection or co-infection with other pathogens not detected by the test.  Clinical correlation with patient history and  other diagnostic information is necessary to determine patient infection status.  The expected result is negative.  Fact Sheet for Patients:   StrictlyIdeas.no   Fact Sheet for Healthcare Providers:   BankingDealers.co.za    This test is not yet approved or cleared by the [place] FDA and  has been authorized for detection and/or diagnosis of SARS-CoV-2 by FDA under an Emergency Use Authorization (EUA).  This EUA will remain in effect (meaning  this test can be used) for the duration of  the COVID-19 declaration under Section 564(b)(1) of the Act, 21 U.S.C. section 360-bbb-3(b)(1), unless the authorization is terminated or revoked sooner.  Performed at [facility], [place] [address], [place], [place] [address]          Radiology Studies: [facility] Chest Port 1 View  Result Date: 09/10/2020 CLINICAL DATA:  COVID-19, shortness of breath. EXAM: PORTABLE CHEST 1 VIEW COMPARISON:  November 29, 2015. FINDINGS: Stable cardiomegaly. No pneumothorax is noted. Increased bilateral perihilar and basilar opacities are noted concerning for pneumonia. No definite pleural effusion is noted. Bony thorax is unremarkable. IMPRESSION: Increased bilateral perihilar and basilar opacities are noted concerning for pneumonia. Electronically Signed   By: Marijo Conception M.D.   On: 09/10/2020 13:17        Scheduled Meds: . vitamin C  500 mg Oral Daily  . baricitinib  4 mg Oral Daily  . enoxaparin (LOVENOX) injection  80 mg Subcutaneous Q24H  .  insulin aspart  0-20 Units Subcutaneous TID WC  . insulin aspart  0-5 Units Subcutaneous QHS  . methylPREDNISolone (  SOLU-MEDROL) injection  0.5 mg/kg Intravenous Q12H   Followed by  . [START ON 09/14/2020] predniSONE  50 mg Oral Daily  . sodium chloride flush  3 mL Intravenous Q12H  . sodium chloride flush  3 mL Intravenous Q12H  . zinc sulfate  220 mg Oral Daily   Continuous Infusions: . sodium chloride    . remdesivir 100 mg in NS 100 mL 100 mg (09/11/20 1120)     LOS: 1 day    Time spent: 35 minutes    Barb Merino, MD [facility] Pager [phone]

## 2020-09-12 LAB — COMPREHENSIVE METABOLIC PANEL
ALT: 29 U/L (ref 0–44)
AST: 46 U/L — ABNORMAL HIGH (ref 15–41)
Albumin: 2.8 g/dL — ABNORMAL LOW (ref 3.5–5.0)
Alkaline Phosphatase: 47 U/L (ref 38–126)
Anion gap: 8 (ref 5–15)
BUN: 32 mg/dL — ABNORMAL HIGH (ref 6–20)
CO2: 32 mmol/L (ref 22–32)
Calcium: 8.1 mg/dL — ABNORMAL LOW (ref 8.9–10.3)
Chloride: 96 mmol/L — ABNORMAL LOW (ref 98–111)
Creatinine, Ser: 1.01 mg/dL — ABNORMAL HIGH (ref 0.44–1.00)
GFR calc Af Amer: >60 mL/min (ref >60)
GFR calc non Af Amer: >60 mL/min (ref >60)
Glucose, Bld: 135 mg/dL — ABNORMAL HIGH (ref 70–99)
Potassium: 4.5 mmol/L (ref 3.5–5.1)
Sodium: 136 mmol/L (ref 135–145)
Total Bilirubin: 0.5 mg/dL (ref 0.3–1.2)
Total Protein: 7.1 g/dL (ref 6.5–8.1)

## 2020-09-12 LAB — CBC WITH DIFFERENTIAL/PLATELET
Abs Immature Granulocytes: 0.15 10*3/uL — ABNORMAL HIGH (ref 0.00–0.07)
Basophils Absolute: 0 10*3/uL (ref 0.0–0.1)
Basophils Relative: 0 %
Eosinophils Absolute: 0 10*3/uL (ref 0.0–0.5)
Eosinophils Relative: 0 %
HCT: 39.3 % (ref 36.0–46.0)
Hemoglobin: 11.6 g/dL — ABNORMAL LOW (ref 12.0–15.0)
Immature Granulocytes: 2 %
Lymphocytes Relative: 16 %
Lymphs Abs: 1.6 10*3/uL (ref 0.7–4.0)
MCH: 29.1 pg (ref 26.0–34.0)
MCHC: 29.5 g/dL — ABNORMAL LOW (ref 30.0–36.0)
MCV: 98.5 fL (ref 80.0–100.0)
Monocytes Absolute: 0.7 10*3/uL (ref 0.1–1.0)
Monocytes Relative: 7 %
Neutro Abs: 7.4 10*3/uL (ref 1.7–7.7)
Neutrophils Relative %: 75 %
Platelets: 363 10*3/uL (ref 150–400)
RBC: 3.99 MIL/uL (ref 3.87–5.11)
RDW: 18.6 % — ABNORMAL HIGH (ref 11.5–15.5)
WBC: 9.8 10*3/uL (ref 4.0–10.5)
nRBC: 1.4 % — ABNORMAL HIGH (ref 0.0–0.2)

## 2020-09-12 LAB — CULTURE, BLOOD (ROUTINE X 2): Special Requests: ADEQUATE

## 2020-09-12 LAB — D-DIMER, QUANTITATIVE: D-Dimer, Quant: 1 ug/mL-FEU — ABNORMAL HIGH (ref 0.00–0.50)

## 2020-09-12 LAB — FERRITIN: Ferritin: 46 ng/mL (ref 11–307)

## 2020-09-12 LAB — CBG MONITORING, ED
Glucose-Capillary: 114 mg/dL — ABNORMAL HIGH (ref 70–99)
Glucose-Capillary: 155 mg/dL — ABNORMAL HIGH (ref 70–99)

## 2020-09-12 LAB — PHOSPHORUS: Phosphorus: 3.3 mg/dL (ref 2.5–4.6)

## 2020-09-12 LAB — MAGNESIUM: Magnesium: 2.5 mg/dL — ABNORMAL HIGH (ref 1.7–2.4)

## 2020-09-12 LAB — MRSA PCR SCREENING: MRSA by PCR: NEGATIVE

## 2020-09-12 LAB — GLUCOSE, CAPILLARY
Glucose-Capillary: 168 mg/dL — ABNORMAL HIGH (ref 70–99)
Glucose-Capillary: 222 mg/dL — ABNORMAL HIGH (ref 70–99)

## 2020-09-12 LAB — C-REACTIVE PROTEIN: CRP: 6.7 mg/dL — ABNORMAL HIGH (ref <1.0)

## 2020-09-12 MED ORDER — CHLORHEXIDINE GLUCONATE CLOTH 2 % EX PADS
6.0000 | MEDICATED_PAD | Freq: Every day | CUTANEOUS | Status: DC
  Administered 2020-09-12 – 2020-09-20 (×6): 6 via TOPICAL

## 2020-09-12 NOTE — ED Notes (Signed)
Patient is resting comfortably. 

## 2020-09-12 NOTE — Progress Notes (Signed)
PROGRESS NOTE    Doylene Splinter  MWN:[id number] DOB: [birth year] DOA: 09/10/2020 PCP: Pcp, No    Brief Narrative:  44 year old female with history of chronic back and neck pain, morbid obesity admitted with shortness of breath and cough.  Symptoms started about 1 week, worse for 2 to 3 days.  In the emergency room COVID-19 positive on 9/7.  Saturations 50%.  Started on high flow nasal cannula and nonrebreather. Patient probably has underlying untreated sleep apnea.  She also has fibroid and bleeding from fibroid and has been looking forward for surgery. Patient is not vaccinated against COVID-19.   Assessment & Plan:   Active Problems:   Acute hypoxemic respiratory failure due to COVID-19 [facility])   Morbid obesity with BMI of 50.0-59.9, adult (HCC)  Acute hypoxemic respiratory failure due to COVID-19 viral infection:  Bilateral pneumonia due to COVID-19: Continue to monitor due to significant symptoms  chest physiotherapy, incentive spirometry, deep breathing exercises, sputum induction, mucolytic's and bronchodilators. Supplemental oxygen to keep saturations more than 90%. Covid directed therapy with , steroids, Solu-Medrol remdesivir, day 3/5 Baricitinib, I discussed in details with patient about use of baricitinib, good results in combination with remdesivir in a patient requiring more oxygen and recent trials as well as emergency use authorization by FDA.  Some side effects could be anticipated including bacterial infection.  Patient has no obvious contraindication to use baricitinib. Started on baricitinib with patient's consent.  T2/14. Due to severity of symptoms, patient will need daily inflammatory markers, chest x-rays, liver function test to monitor and direct COVID-19 therapies.  COVID-19 Labs  Recent Labs    09/10/20 1209 09/11/20 0432 09/11/20 0451 09/12/20 0235  DDIMER 0.77*  --  1.12* 1.00*  FERRITIN 47 46  --  46  LDH 350*  --   --   --   CRP 7.6* 10.8*  --  6.7*      Lab Results  Component Value Date   SARSCOV2NAA POSITIVE (A) 09/10/2020    SpO2: 95 % O2 Flow Rate (L/min): 13 L/min  Morbid obesity: BMI more than 50.  Patient will definitely benefit with weight loss surgery.  She has outpatient follow-up.  Menorrhagia with fibroid uterus: Significant problem.  Hemoglobin is stable.  DVT prophylaxis: Lovenox.   Code Status: Full code Family Communication: Patient's husband and daughter on the phone Disposition Plan: Status is: Inpatient  Remains inpatient appropriate because:Inpatient level of care appropriate due to severity of illness   Dispo: The patient is from: Home              Anticipated d/c is to: Home              Anticipated d/c date is: > 3 days              Patient currently is not medically stable to d/c.         Consultants:   None  Procedures:   None  Antimicrobials:  Antibiotics Given (last 72 hours)    Date/Time Action Medication Dose Rate   09/10/20 1653 New Bag/Given   remdesivir 200 mg in sodium chloride 0.9% 250 mL IVPB 200 mg 580 mL/hr   09/11/20 1120 New Bag/Given   remdesivir 100 mg in sodium chloride 0.9 % 100 mL IVPB 100 mg 200 mL/hr   09/12/20 0818 New Bag/Given   remdesivir 100 mg in sodium chloride 0.9 % 100 mL IVPB 100 mg 200 mL/hr         Subjective: Patient seen  and examined.  She is still in the emergency room.  Denies any significant shortness of breath at rest.  Difficulty moving around and going to bedside commode.  Afebrile.  On high flow oxygen.  Currently on 13 L.  Sleepy and tired. Overnight events noted.  I was notified by nursing staff that her pure wic is draining a lot of blood into the canister.  We did a perineal exam, her urethra was without any blood and urine was clear.  Objective: Vitals:   09/12/20 1015 09/12/20 1045 09/12/20 1100 09/12/20 1200  BP: (!) 148/114 135/89 (!) 135/94 135/83  Pulse: 67 71 63 67  Resp: (!) 26 (!) 37 (!) 24 (!) 29  Temp:       TempSrc:      SpO2: 90% 95% 94% 95%  Weight:      Height:        Intake/Output Summary (Last 24 hours) at 09/12/2020 1239 Last data filed at 09/12/2020 0934 Gross per 24 hour  Intake 303 ml  Output --  Net 303 ml   Filed Weights   09/10/20 1538  Weight: (!) 154.5 kg    Examination:  General exam: Appears in mild respiratory distress.  With shortness of breath. Chronically sick looking. Respiratory system: Poor bilateral air entry.  No added sounds.  Difficult to auscultate. Cardiovascular system: S1 & S2 heard, RRR. No JVD, murmurs, rubs, gallops or clicks. No pedal edema. Gastrointestinal system: Distended but not tender.  Obese and pendulous. Central nervous system: Alert and oriented. No focal neurological deficits. Extremities: Symmetric 5 x 5 power. Skin: No rashes, lesions or ulcers Psychiatry: Judgement and insight appear normal. Mood & affect appropriate.  Perineal exam with two female nursing staff at the bedside. External perineum was clean.  Without any blood. Urethra was visible, tried straight cath, however patient was able to urinate and was clear. She had obvious blood on her vagina.  Vaginal examination was not done.   Data Reviewed: I have personally reviewed following labs and imaging studies  CBC: Recent Labs  Lab 09/10/20 1209 09/11/20 0451 09/12/20 0235  WBC 7.6 12.1* 9.8  NEUTROABS 6.1 9.8* 7.4  HGB 10.8* 11.7* 11.6*  HCT 35.6* 39.6 39.3  MCV 96.2 99.5 98.5  PLT 345 352 194   Basic Metabolic Panel: Recent Labs  Lab 09/10/20 1209 09/11/20 0432 09/12/20 0235  NA 138 137 136  K 3.7 5.0 4.5  CL 95* 94* 96*  CO2 29 31 32  GLUCOSE 164* 161* 135*  BUN 18 24* 32*  CREATININE 0.99 1.00 1.01*  CALCIUM 7.9* 8.0* 8.1*  MG  --  2.4 2.5*  PHOS  --  4.9* 3.3   GFR: Estimated Creatinine Clearance: 106.2 mL/min (A) (by C-G formula based on SCr of 1.01 mg/dL (H)). Liver Function Tests: Recent Labs  Lab 09/10/20 1209 09/11/20 0432  09/12/20 0235  AST 41 66* 46*  ALT 23 30 29   ALKPHOS 39 48 47  BILITOT 0.3 0.5 0.5  PROT 6.5 7.3 7.1  ALBUMIN 2.7* 3.0* 2.8*   No results for input(s): LIPASE, AMYLASE in the last 168 hours. No results for input(s): AMMONIA in the last 168 hours. Coagulation Profile: No results for input(s): INR, PROTIME in the last 168 hours. Cardiac Enzymes: No results for input(s): CKTOTAL, CKMB, CKMBINDEX, TROPONINI in the last 168 hours. BNP (last 3 results) No results for input(s): PROBNP in the last 8760 hours. HbA1C: No results for input(s): HGBA1C in the last 72 hours. CBG:  Recent Labs  Lab 09/11/20 1151 09/11/20 1649 09/11/20 2007 09/12/20 0725 09/12/20 1145  GLUCAP 162* 132* 149* 114* 155*   Lipid Profile: Recent Labs    09/10/20 1209  TRIG 111   Thyroid Function Tests: No results for input(s): TSH, T4TOTAL, FREET4, T3FREE, THYROIDAB in the last 72 hours. Anemia Panel: Recent Labs    09/11/20 0432 09/12/20 0235  FERRITIN 46 46   Sepsis Labs: Recent Labs  Lab 09/10/20 1209  PROCALCITON <0.10  LATICACIDVEN 1.8    Recent Results (from the past 240 hour(s))  Blood Culture (routine x 2)     Status: None (Preliminary result)   Collection Time: 09/10/20 12:09 PM   Specimen: BLOOD  Result Value Ref Range Status   Specimen Description   Final    BLOOD LEFT ANTECUBITAL Performed at [facility], [place] [address], [place], [place] [address]    Special Requests   Final    BOTTLES DRAWN AEROBIC AND ANAEROBIC Blood Culture adequate volume Performed at [place] [address] [place], [place] [address]    Culture   Final    NO GROWTH 1 DAY Performed at [facility], [place] [address], [place], [place] [address]    Report Status PENDING  Incomplete  Blood Culture (routine x 2)     Status: Abnormal   Collection Time: 09/10/20 12:25 PM   Specimen: BLOOD  Result Value Ref Range Status   Specimen Description   Final     BLOOD RIGHT ANTECUBITAL Performed at [place] [address], [place], [place] [address]    Special Requests   Final    BOTTLES DRAWN AEROBIC AND ANAEROBIC Blood Culture adequate volume Performed at [place] [address], [place], [place] [address]    Culture  Setup Time   Final    GRAM POSITIVE COCCI IN CLUSTERS AEROBIC BOTTLE ONLY CRITICAL RESULT CALLED TO, READ BACK BY AND VERIFIED WITH: PHARMD MICHELLE L. [phone] FCP    Culture (A)  Final    STAPHYLOCOCCUS HOMINIS THE SIGNIFICANCE OF ISOLATING THIS ORGANISM FROM A SINGLE SET OF BLOOD CULTURES WHEN MULTIPLE SETS ARE DRAWN IS UNCERTAIN. PLEASE NOTIFY THE MICROBIOLOGY DEPARTMENT WITHIN ONE WEEK IF SPECIATION AND SENSITIVITIES ARE REQUIRED. Performed at [facility], [place] [address] [place], [place] [address]    Report Status 09/12/2020 FINAL  Final  Blood Culture ID Panel (Reflexed)     Status: Abnormal   Collection Time: 09/10/20 12:25 PM  Result Value Ref Range Status   Enterococcus faecalis NOT DETECTED NOT DETECTED Final   Enterococcus Faecium NOT DETECTED NOT DETECTED Final   Listeria monocytogenes NOT DETECTED NOT DETECTED Final   Staphylococcus species DETECTED (A) NOT DETECTED Final    Comment: CRITICAL RESULT CALLED TO, READ BACK BY AND VERIFIED WITH: PHARMD MICHELLE L. [phone] FCP    Staphylococcus aureus (BCID) NOT DETECTED NOT DETECTED Final   Staphylococcus epidermidis NOT DETECTED NOT DETECTED Final   Staphylococcus lugdunensis NOT DETECTED NOT DETECTED Final   Streptococcus species NOT DETECTED NOT DETECTED Final   Streptococcus agalactiae NOT DETECTED NOT DETECTED Final   Streptococcus pneumoniae NOT DETECTED NOT DETECTED Final   Streptococcus pyogenes NOT DETECTED NOT DETECTED Final   A.calcoaceticus-baumannii NOT DETECTED NOT DETECTED Final   Bacteroides fragilis NOT DETECTED NOT DETECTED Final   Enterobacterales NOT DETECTED NOT DETECTED Final    Enterobacter cloacae complex NOT DETECTED NOT DETECTED Final   Escherichia coli  NOT DETECTED NOT DETECTED Final   Klebsiella aerogenes NOT DETECTED NOT DETECTED Final   Klebsiella oxytoca NOT DETECTED NOT DETECTED Final   Klebsiella pneumoniae NOT DETECTED NOT DETECTED Final   Proteus species NOT DETECTED NOT DETECTED Final   Salmonella species NOT DETECTED NOT DETECTED Final   Serratia marcescens NOT DETECTED NOT DETECTED Final   Haemophilus influenzae NOT DETECTED NOT DETECTED Final   Neisseria meningitidis NOT DETECTED NOT DETECTED Final   Pseudomonas aeruginosa NOT DETECTED NOT DETECTED Final   Stenotrophomonas maltophilia NOT DETECTED NOT DETECTED Final   Candida albicans NOT DETECTED NOT DETECTED Final   Candida auris NOT DETECTED NOT DETECTED Final   Candida glabrata NOT DETECTED NOT DETECTED Final   Candida krusei NOT DETECTED NOT DETECTED Final   Candida parapsilosis NOT DETECTED NOT DETECTED Final   Candida tropicalis NOT DETECTED NOT DETECTED Final   Cryptococcus neoformans/gattii NOT DETECTED NOT DETECTED Final    Comment: Performed at [facility], [place] [address] [place], [place] [address]  SARS Coronavirus 2 by RT PCR (hospital order, performed in [facility] hospital lab) Nasopharyngeal Nasopharyngeal Swab     Status: Abnormal   Collection Time: 09/10/20  2:40 PM   Specimen: Nasopharyngeal Swab  Result Value Ref Range Status   SARS Coronavirus 2 POSITIVE (A) NEGATIVE Final    Comment: RESULT CALLED TO, READ BACK BY AND VERIFIED WITH: WEST,S. RN @1638  ON 09.14.2021 BY COHEN,K (NOTE) SARS-CoV-2 target nucleic acids are DETECTED  SARS-CoV-2 RNA is generally detectable in upper respiratory specimens  during the acute phase of infection.  Positive results are indicative  of the presence of the identified virus, but do not rule out bacterial infection or co-infection with other pathogens not detected by the test.  Clinical correlation with patient history and  other  diagnostic information is necessary to determine patient infection status.  The expected result is negative.  Fact Sheet for Patients:   StrictlyIdeas.no   Fact Sheet for Healthcare Providers:   BankingDealers.co.za    This test is not yet approved or cleared by the [place] FDA and  has been authorized for detection and/or diagnosis of SARS-CoV-2 by FDA under an Emergency Use Authorization (EUA).  This EUA will remain in effect (meaning  this test can be used) for the duration of  the COVID-19 declaration under Section 564(b)(1) of the Act, 21 U.S.C. section 360-bbb-3(b)(1), unless the authorization is terminated or revoked sooner.  Performed at [facility], [place] [address], [place], [place] [address]          Radiology Studies: [facility] Chest Port 1 View  Result Date: 09/10/2020 CLINICAL DATA:  COVID-19, shortness of breath. EXAM: PORTABLE CHEST 1 VIEW COMPARISON:  November 29, 2015. FINDINGS: Stable cardiomegaly. No pneumothorax is noted. Increased bilateral perihilar and basilar opacities are noted concerning for pneumonia. No definite pleural effusion is noted. Bony thorax is unremarkable. IMPRESSION: Increased bilateral perihilar and basilar opacities are noted concerning for pneumonia. Electronically Signed   By: Marijo Conception M.D.   On: 09/10/2020 13:17        Scheduled Meds: . vitamin C  500 mg Oral Daily  . baricitinib  4 mg Oral Daily  . enoxaparin (LOVENOX) injection  80 mg Subcutaneous Q24H  . insulin aspart  0-20 Units Subcutaneous TID WC  . insulin aspart  0-5 Units Subcutaneous QHS  . methylPREDNISolone (SOLU-MEDROL) injection  0.5 mg/kg Intravenous Q12H   Followed by  . [START ON 09/14/2020] predniSONE  50 mg Oral Daily  . sodium chloride flush  3 mL Intravenous Q12H  . sodium chloride flush  3 mL Intravenous Q12H  . zinc sulfate  220 mg Oral Daily   Continuous Infusions: . sodium  chloride    . remdesivir 100 mg in NS 100 mL Stopped (09/12/20 0934)     LOS: 2 days    Time spent: 35 minutes    Barb Merino, MD [facility] Pager [phone]

## 2020-09-12 NOTE — Plan of Care (Signed)

## 2020-09-12 NOTE — ED Notes (Signed)
Attempted to call report, nurse in a room and will call me back.

## 2020-09-13 LAB — BLOOD GAS, ARTERIAL
Acid-Base Excess: 7.9 mmol/L — ABNORMAL HIGH (ref 0.0–2.0)
Bicarbonate: 34.5 mmol/L — ABNORMAL HIGH (ref 20.0–28.0)
Drawn by: 270211
FIO2: 80
O2 Content: 15 L/min
O2 Saturation: 91.6 %
Patient temperature: 98.6
pCO2 arterial: 60.7 mmHg — ABNORMAL HIGH (ref 32.0–48.0)
pH, Arterial: 7.373 (ref 7.350–7.450)
pO2, Arterial: 68.2 mmHg — ABNORMAL LOW (ref 83.0–108.0)

## 2020-09-13 LAB — CBC WITH DIFFERENTIAL/PLATELET
Abs Immature Granulocytes: 0.09 10*3/uL — ABNORMAL HIGH (ref 0.00–0.07)
Basophils Absolute: 0 10*3/uL (ref 0.0–0.1)
Basophils Relative: 0 %
Eosinophils Absolute: 0 10*3/uL (ref 0.0–0.5)
Eosinophils Relative: 0 %
HCT: 41.1 % (ref 36.0–46.0)
Hemoglobin: 11.9 g/dL — ABNORMAL LOW (ref 12.0–15.0)
Immature Granulocytes: 1 %
Lymphocytes Relative: 14 %
Lymphs Abs: 1.1 10*3/uL (ref 0.7–4.0)
MCH: 29.1 pg (ref 26.0–34.0)
MCHC: 29 g/dL — ABNORMAL LOW (ref 30.0–36.0)
MCV: 100.5 fL — ABNORMAL HIGH (ref 80.0–100.0)
Monocytes Absolute: 0.7 10*3/uL (ref 0.1–1.0)
Monocytes Relative: 10 %
Neutro Abs: 5.8 10*3/uL (ref 1.7–7.7)
Neutrophils Relative %: 75 %
Platelets: 332 10*3/uL (ref 150–400)
RBC: 4.09 MIL/uL (ref 3.87–5.11)
RDW: 18.6 % — ABNORMAL HIGH (ref 11.5–15.5)
WBC: 7.7 10*3/uL (ref 4.0–10.5)
nRBC: 2 % — ABNORMAL HIGH (ref 0.0–0.2)

## 2020-09-13 LAB — MAGNESIUM: Magnesium: 2.7 mg/dL — ABNORMAL HIGH (ref 1.7–2.4)

## 2020-09-13 LAB — PHOSPHORUS: Phosphorus: 3.1 mg/dL (ref 2.5–4.6)

## 2020-09-13 LAB — COMPREHENSIVE METABOLIC PANEL
ALT: 41 U/L (ref 0–44)
AST: 52 U/L — ABNORMAL HIGH (ref 15–41)
Albumin: 2.8 g/dL — ABNORMAL LOW (ref 3.5–5.0)
Alkaline Phosphatase: 49 U/L (ref 38–126)
Anion gap: 9 (ref 5–15)
BUN: 26 mg/dL — ABNORMAL HIGH (ref 6–20)
CO2: 35 mmol/L — ABNORMAL HIGH (ref 22–32)
Calcium: 8.5 mg/dL — ABNORMAL LOW (ref 8.9–10.3)
Chloride: 97 mmol/L — ABNORMAL LOW (ref 98–111)
Creatinine, Ser: 0.95 mg/dL (ref 0.44–1.00)
GFR calc Af Amer: >60 mL/min (ref >60)
GFR calc non Af Amer: >60 mL/min (ref >60)
Glucose, Bld: 115 mg/dL — ABNORMAL HIGH (ref 70–99)
Potassium: 5.1 mmol/L (ref 3.5–5.1)
Sodium: 141 mmol/L (ref 135–145)
Total Bilirubin: 0.5 mg/dL (ref 0.3–1.2)
Total Protein: 6.9 g/dL (ref 6.5–8.1)

## 2020-09-13 LAB — FERRITIN: Ferritin: 38 ng/mL (ref 11–307)

## 2020-09-13 LAB — D-DIMER, QUANTITATIVE: D-Dimer, Quant: 1.17 ug/mL-FEU — ABNORMAL HIGH (ref 0.00–0.50)

## 2020-09-13 LAB — GLUCOSE, CAPILLARY
Glucose-Capillary: 114 mg/dL — ABNORMAL HIGH (ref 70–99)
Glucose-Capillary: 122 mg/dL — ABNORMAL HIGH (ref 70–99)
Glucose-Capillary: 123 mg/dL — ABNORMAL HIGH (ref 70–99)

## 2020-09-13 LAB — C-REACTIVE PROTEIN: CRP: 3.1 mg/dL — ABNORMAL HIGH (ref <1.0)

## 2020-09-13 NOTE — Evaluation (Signed)
Physical Therapy Evaluation Patient Details Name: Sophia Mendez MRN: [id number] DOB: Oct 27, 1976 Today's Date: 09/13/2020   History of Present Illness  Patient is 44 y.o. female with PMH significant for chronic back and neck pain, and morbid obesity, sleep apnea, and obesity hypoventilation syndrome. Pt admitted with shortness of breath and cough.  Symptoms started ~ 1 week PTA and worsened for 2 to 3 days.  Pt unvaccinated and COVID-19 positive on 9/7 in [facility] ED; saturations 50%.  Started on high flow nasal cannula and nonrebreather.    Clinical Impression  Sophia Mendez is 44 y.o. female admitted with above HPI and diagnosis. Patient is currently limited by functional impairments below (see PT problem list). Patient lives with her family and is independent at baseline. Patient currently requires 15L/min HFNC to maintain saturations >88% with activity and was able to take short steps in room with min guard assist for safety. Patient easily fatigued and required seated rest break due to coughing bout. Patient will benefit from continued skilled PT interventions to address impairments and progress independence with mobility, recommending HHPT with intermittent assist. Acute PT will follow and progress as able.     Follow Up Recommendations Home health PT;Supervision - Intermittent    Equipment Recommendations  Other (comment);Rolling walker with 5" wheels (TBA, bariatric RW)    Recommendations for Other Services       Precautions / Restrictions Precautions Precautions: Fall Restrictions Weight Bearing Restrictions: No      Mobility  Bed Mobility               General bed mobility comments: pt OOB in recliner and ended in recliner.  Transfers Overall transfer level: Needs assistance Equipment used: None Transfers: Sit to/from Stand Sit to Stand: Min guard;Supervision         General transfer comment: pt using UE on arm rest or on bed rail to initiate power up, min guard for  safety. pt steady in standing.   Ambulation/Gait Ambulation/Gait assistance: Min guard Gait Distance (Feet): 14 Feet Assistive device: None Gait Pattern/deviations: Step-through pattern;Decreased step length - right;Decreased step length - left;Decreased stride length;Wide base of support Gait velocity: decr   General Gait Details: pt taking short steps forward in room and backward towards recliner. pt performed 2x with seated break between. SpO2 remained in 88% or greater and HR from 60's>80's with activity. No overt LOB noted however pt easily fatigued. Pt experiienced coughing fit after first bout.  Stairs         Wheelchair Mobility    Modified Rankin (Stroke Patients Only)       Balance Overall balance assessment: Needs assistance Sitting-balance support: Feet supported Sitting balance-Leahy Scale: Good     Standing balance support: During functional activity;No upper extremity supported Standing balance-Leahy Scale: Good          Pertinent Vitals/Pain Pain Assessment: Faces Faces Pain Scale: Hurts a little bit Pain Location: lower back Pain Descriptors / Indicators: Aching Pain Intervention(s): Monitored during session;Limited activity within patient's tolerance;Repositioned    Home Living Family/patient expects to be discharged to:: Private residence Living Arrangements: Children;Other relatives;Spouse/significant other Available Help at Discharge: Family Type of Home: House Home Access: Stairs to enter Entrance Stairs-Rails: Can reach both Entrance Stairs-Number of Steps: 4 Home Layout: Two level;Bed/bath upstairs;Able to live on main level with bedroom/bathroom;1/2 bath on main level Home Equipment: None Additional Comments: pt reports she can stay on the main level     Prior Function Level of Independence: Independent  Hand Dominance        Extremity/Trunk Assessment   Upper Extremity Assessment Upper Extremity Assessment:  Overall WFL for tasks assessed    Lower Extremity Assessment Lower Extremity Assessment: Overall WFL for tasks assessed    Cervical / Trunk Assessment Cervical / Trunk Assessment: Lordotic;Other exceptions Cervical / Trunk Exceptions: body habitus  Communication   Communication: No difficulties  Cognition Arousal/Alertness: Awake/alert Behavior During Therapy: WFL for tasks assessed/performed Overall Cognitive Status: Within Functional Limits for tasks assessed               General Comments      Exercises Other Exercises Other Exercises: reviewed pursed lip breathing with patient, 2x 5 reps.    Assessment/Plan    PT Assessment Patient needs continued PT services  PT Problem List Decreased strength;Decreased activity tolerance;Decreased balance;Decreased mobility;Cardiopulmonary status limiting activity;Obesity       PT Treatment Interventions DME instruction;Gait training;Stair training;Functional mobility training;Balance training;Therapeutic exercise;Therapeutic activities;Patient/family education    PT Goals (Current goals can be found in the Care Plan section)  Acute Rehab PT Goals Patient Stated Goal: stop feeling so sick PT Goal Formulation: With patient Time For Goal Achievement: 09/27/20 Potential to Achieve Goals: Good    Frequency Min 3X/week   Barriers to discharge           AM-PAC PT "6 Clicks" Mobility  Outcome Measure Help needed turning from your back to your side while in a flat bed without using bedrails?: A Little Help needed moving from lying on your back to sitting on the side of a flat bed without using bedrails?: A Little Help needed moving to and from a bed to a chair (including a wheelchair)?: A Little Help needed standing up from a chair using your arms (e.g., wheelchair or bedside chair)?: A Little Help needed to walk in hospital room?: A Little Help needed climbing 3-5 steps with a railing? : A Lot 6 Click Score: 17    End of  Session Equipment Utilized During Treatment: Gait belt Activity Tolerance: Patient tolerated treatment well;Patient limited by fatigue Patient left: in chair;with call bell/phone within reach Nurse Communication: Mobility status PT Visit Diagnosis: Difficulty in walking, not elsewhere classified (R26.2);Muscle weakness (generalized) (M62.81);Unsteadiness on feet (R26.81)    Time: 4270-6237 PT Time Calculation (min) (ACUTE ONLY): 29 min   Charges:   PT Evaluation $PT Eval Moderate Complexity: 1 Mod PT Treatments $Therapeutic Activity: 8-22 mins       Verner Mould, DPT Acute Rehabilitation Services  Office [phone] Pager [phone]  09/13/2020 7:05 PM

## 2020-09-13 NOTE — Progress Notes (Signed)
PROGRESS NOTE    Sophia Mendez  KVQ:[id number] DOB: 05-16-1976 DOA: 09/10/2020 PCP: Pcp, No    Brief Narrative:  44 year old female with history of chronic back and neck pain, morbid obesity admitted with shortness of breath and cough.  Symptoms started about 1 week, worse for 2 to 3 days.  In the emergency room COVID-19 positive on 9/7.  Saturations 50%.  Started on high flow nasal cannula and nonrebreather. Patient probably has underlying untreated sleep apnea and obesity hypoventilation syndrome.  She also has fibroid and bleeding from fibroid and has been looking forward for surgery. Patient is not vaccinated against COVID-19.   Assessment & Plan:   Active Problems:   Acute hypoxemic respiratory failure due to COVID-19 [facility])   Morbid obesity with BMI of 50.0-59.9, adult (HCC)  Acute hypoxemic respiratory failure due to COVID-19 viral infection:  Bilateral pneumonia due to COVID-19: Continue to monitor due to significant symptoms  chest physiotherapy, incentive spirometry, deep breathing exercises, sputum induction, mucolytic's and bronchodilators. Supplemental oxygen to keep saturations more than 85 %. Covid directed therapy with , steroids, Solu-Medrol remdesivir, day 4/5 Baricitinib, I discussed in details with patient about use of baricitinib, good results in combination with remdesivir in a patient requiring more oxygen and recent trials as well as emergency use authorization by FDA.  Some side effects could be anticipated including bacterial infection.  Patient has no obvious contraindication to use baricitinib. Started on baricitinib with patient's consent.  Day 3/14. Due to severity of symptoms, patient will need daily inflammatory markers, chest x-rays, liver function test to monitor and direct COVID-19 therapies.  COVID-19 Labs  Recent Labs    09/10/20 1209 09/10/20 1209 09/11/20 0432 09/11/20 0451 09/12/20 0235 09/13/20 0250  DDIMER 0.77*   < >  --  1.12* 1.00*  1.17*  FERRITIN 47   < > 46  --  46 38  LDH 350*  --   --   --   --   --   CRP 7.6*   < > 10.8*  --  6.7* 3.1*   < > = values in this interval not displayed.    Lab Results  Component Value Date   SARSCOV2NAA POSITIVE (A) 09/10/2020    SpO2: 95 % O2 Flow Rate (L/min): 15 L/min  Morbid obesity: BMI more than 50.  Patient will definitely benefit with weight loss surgery.  She has outpatient follow-up. Has underlying untreated sleep apnea and obesity hypoventilation syndrome.  She is lethargic.  Will check blood gas today to see carbon oxide level.  Menorrhagia with fibroid uterus: Significant problem.  Hemoglobin is stable.  DVT prophylaxis: Lovenox.   Code Status: Full code Family Communication: Patient's husband and daughter on the phone Disposition Plan: Status is: Inpatient  Remains inpatient appropriate because:Inpatient level of care appropriate due to severity of illness   Dispo: The patient is from: Home              Anticipated d/c is to: Home              Anticipated d/c date is: > 3 days              Patient currently is not medically stable to d/c.         Consultants:   None  Procedures:   None  Antimicrobials:  Antibiotics Given (last 72 hours)    Date/Time Action Medication Dose Rate   09/10/20 1653 New Bag/Given   remdesivir 200 mg in sodium chloride  0.9% 250 mL IVPB 200 mg 580 mL/hr   09/11/20 1120 New Bag/Given   remdesivir 100 mg in sodium chloride 0.9 % 100 mL IVPB 100 mg 200 mL/hr   09/12/20 0818 New Bag/Given   remdesivir 100 mg in sodium chloride 0.9 % 100 mL IVPB 100 mg 200 mL/hr   09/13/20 0956 New Bag/Given   remdesivir 100 mg in sodium chloride 0.9 % 100 mL IVPB 100 mg 200 mL/hr         Subjective: Seen and examined.  On high flow oxygen.  Sleepy and tired.  Denies any complaints.  Objective: Vitals:   09/13/20 0500 09/13/20 0600 09/13/20 0700 09/13/20 0800  BP: (!) 161/122 (!) 174/111 (!) 166/140 (!) 173/110  Pulse: 68  66 70 70  Resp: (!) 30 (!) 28 (!) 27 (!) 24  Temp:    (!) 96.8 F (36 C)  TempSrc:    Axillary  SpO2: 91% 96% 90% 95%  Weight:      Height:        Intake/Output Summary (Last 24 hours) at 09/13/2020 1058 Last data filed at 09/13/2020 0200 Gross per 24 hour  Intake 240 ml  Output 950 ml  Net -710 ml   Filed Weights   09/10/20 1538  Weight: (!) 154.5 kg    Examination:  General exam: Sick looking.  Tired.  Sleepy.  On 15 L high flow. Respiratory system: Poor bilateral air entry.  No added sounds.  Difficult to auscultate due to obesity. Cardiovascular system: S1 & S2 heard, RRR. No JVD, murmurs, rubs, gallops or clicks. No pedal edema. Gastrointestinal system: Distended but not tender.  Obese and pendulous. Central nervous system: Alert and oriented. No focal neurological deficits. Extremities: Symmetric 5 x 5 power. Skin: No rashes, lesions or ulcers Psychiatry: Judgement and insight appear normal. Mood & affect flat and anxious.    Data Reviewed: I have personally reviewed following labs and imaging studies  CBC: Recent Labs  Lab 09/10/20 1209 09/11/20 0451 09/12/20 0235 09/13/20 0250  WBC 7.6 12.1* 9.8 7.7  NEUTROABS 6.1 9.8* 7.4 5.8  HGB 10.8* 11.7* 11.6* 11.9*  HCT 35.6* 39.6 39.3 41.1  MCV 96.2 99.5 98.5 100.5*  PLT 345 352 363 494   Basic Metabolic Panel: Recent Labs  Lab 09/10/20 1209 09/11/20 0432 09/12/20 0235 09/13/20 0250  NA 138 137 136 141  K 3.7 5.0 4.5 5.1  CL 95* 94* 96* 97*  CO2 29 31 32 35*  GLUCOSE 164* 161* 135* 115*  BUN 18 24* 32* 26*  CREATININE 0.99 1.00 1.01* 0.95  CALCIUM 7.9* 8.0* 8.1* 8.5*  MG  --  2.4 2.5* 2.7*  PHOS  --  4.9* 3.3 3.1   GFR: Estimated Creatinine Clearance: 112.9 mL/min (by C-G formula based on SCr of 0.95 mg/dL). Liver Function Tests: Recent Labs  Lab 09/10/20 1209 09/11/20 0432 09/12/20 0235 09/13/20 0250  AST 41 66* 46* 52*  ALT 23 30 29  41  ALKPHOS 39 48 47 49  BILITOT 0.3 0.5 0.5 0.5  PROT  6.5 7.3 7.1 6.9  ALBUMIN 2.7* 3.0* 2.8* 2.8*   No results for input(s): LIPASE, AMYLASE in the last 168 hours. No results for input(s): AMMONIA in the last 168 hours. Coagulation Profile: No results for input(s): INR, PROTIME in the last 168 hours. Cardiac Enzymes: No results for input(s): CKTOTAL, CKMB, CKMBINDEX, TROPONINI in the last 168 hours. BNP (last 3 results) No results for input(s): PROBNP in the last 8760 hours. HbA1C: No  results for input(s): HGBA1C in the last 72 hours. CBG: Recent Labs  Lab 09/12/20 0725 09/12/20 1145 09/12/20 1630 09/12/20 2128 09/13/20 0803  GLUCAP 114* 155* 168* 222* 114*   Lipid Profile: Recent Labs    09/10/20 1209  TRIG 111   Thyroid Function Tests: No results for input(s): TSH, T4TOTAL, FREET4, T3FREE, THYROIDAB in the last 72 hours. Anemia Panel: Recent Labs    09/12/20 0235 09/13/20 0250  FERRITIN 46 38   Sepsis Labs: Recent Labs  Lab 09/10/20 1209  PROCALCITON <0.10  LATICACIDVEN 1.8    Recent Results (from the past 240 hour(s))  Blood Culture (routine x 2)     Status: None (Preliminary result)   Collection Time: 09/10/20 12:09 PM   Specimen: BLOOD  Result Value Ref Range Status   Specimen Description   Final    BLOOD LEFT ANTECUBITAL Performed at [facility], [place] [address] [place], [place] [address]    Special Requests   Final    BOTTLES DRAWN AEROBIC AND ANAEROBIC Blood Culture adequate volume Performed at [place] [address] [place], [place] [address]    Culture   Final    NO GROWTH 3 DAYS Performed at [facility], [place] [address] [place], [place] [address]    Report Status PENDING  Incomplete  Blood Culture (routine x 2)     Status: Abnormal   Collection Time: 09/10/20 12:25 PM   Specimen: BLOOD  Result Value Ref Range Status   Specimen Description   Final    BLOOD RIGHT ANTECUBITAL Performed at [place] [address] [place], [place] [address]    Special Requests   Final    BOTTLES DRAWN AEROBIC AND ANAEROBIC Blood Culture adequate volume Performed at [place] [address], [place], [place] [address]    Culture  Setup Time   Final    GRAM POSITIVE COCCI IN CLUSTERS AEROBIC BOTTLE ONLY CRITICAL RESULT CALLED TO, READ BACK BY AND VERIFIED WITH: PHARMD MICHELLE L. [phone] FCP    Culture (A)  Final    STAPHYLOCOCCUS HOMINIS THE SIGNIFICANCE OF ISOLATING THIS ORGANISM FROM A SINGLE SET OF BLOOD CULTURES WHEN MULTIPLE SETS ARE DRAWN IS UNCERTAIN. PLEASE NOTIFY THE MICROBIOLOGY DEPARTMENT WITHIN ONE WEEK IF SPECIATION AND SENSITIVITIES ARE REQUIRED. Performed at [facility], [place] [address], [place],  [address]    Report Status 09/12/2020 FINAL  Final  Blood Culture ID Panel (Reflexed)     Status: Abnormal   Collection Time: 09/10/20 12:25 PM  Result Value Ref Range Status   Enterococcus faecalis NOT DETECTED NOT DETECTED Final   Enterococcus Faecium NOT DETECTED NOT DETECTED Final   Listeria monocytogenes NOT DETECTED NOT DETECTED Final   Staphylococcus species DETECTED (A) NOT DETECTED Final    Comment: CRITICAL RESULT CALLED TO, READ BACK BY AND VERIFIED WITH: PHARMD MICHELLE L. [phone] FCP    Staphylococcus aureus (BCID) NOT DETECTED NOT DETECTED Final   Staphylococcus epidermidis NOT DETECTED NOT DETECTED Final   Staphylococcus lugdunensis NOT DETECTED NOT DETECTED Final   Streptococcus species NOT DETECTED NOT DETECTED Final   Streptococcus agalactiae NOT DETECTED NOT DETECTED Final   Streptococcus pneumoniae NOT DETECTED NOT DETECTED Final   Streptococcus pyogenes NOT DETECTED NOT DETECTED Final   A.calcoaceticus-baumannii NOT DETECTED NOT DETECTED Final   Bacteroides fragilis NOT DETECTED NOT DETECTED Final   Enterobacterales NOT DETECTED NOT DETECTED Final   Enterobacter cloacae complex  NOT DETECTED NOT DETECTED Final   Escherichia coli NOT  DETECTED NOT DETECTED Final   Klebsiella aerogenes NOT DETECTED NOT DETECTED Final   Klebsiella oxytoca NOT DETECTED NOT DETECTED Final   Klebsiella pneumoniae NOT DETECTED NOT DETECTED Final   Proteus species NOT DETECTED NOT DETECTED Final   Salmonella species NOT DETECTED NOT DETECTED Final   Serratia marcescens NOT DETECTED NOT DETECTED Final   Haemophilus influenzae NOT DETECTED NOT DETECTED Final   Neisseria meningitidis NOT DETECTED NOT DETECTED Final   Pseudomonas aeruginosa NOT DETECTED NOT DETECTED Final   Stenotrophomonas maltophilia NOT DETECTED NOT DETECTED Final   Candida albicans NOT DETECTED NOT DETECTED Final   Candida auris NOT DETECTED NOT DETECTED Final   Candida glabrata NOT DETECTED NOT DETECTED Final   Candida krusei NOT DETECTED NOT DETECTED Final   Candida parapsilosis NOT DETECTED NOT DETECTED Final   Candida tropicalis NOT DETECTED NOT DETECTED Final   Cryptococcus neoformans/gattii NOT DETECTED NOT DETECTED Final    Comment: Performed at [facility], [place] [address], [place], [place] [address]  SARS Coronavirus 2 by RT PCR (hospital order, performed in [facility] hospital lab) Nasopharyngeal Nasopharyngeal Swab     Status: Abnormal   Collection Time: 09/10/20  2:40 PM   Specimen: Nasopharyngeal Swab  Result Value Ref Range Status   SARS Coronavirus 2 POSITIVE (A) NEGATIVE Final    Comment: RESULT CALLED TO, READ BACK BY AND VERIFIED WITH: WEST,S. RN @1638  ON 09.14.2021 BY COHEN,K (NOTE) SARS-CoV-2 target nucleic acids are DETECTED  SARS-CoV-2 RNA is generally detectable in upper respiratory specimens  during the acute phase of infection.  Positive results are indicative  of the presence of the identified virus, but do not rule out bacterial infection or co-infection with other pathogens not detected by the test.  Clinical correlation with patient history and  other diagnostic information is necessary to determine patient infection status.  The  expected result is negative.  Fact Sheet for Patients:   StrictlyIdeas.no   Fact Sheet for Healthcare Providers:   BankingDealers.co.za    This test is not yet approved or cleared by the [place] FDA and  has been authorized for detection and/or diagnosis of SARS-CoV-2 by FDA under an Emergency Use Authorization (EUA).  This EUA will remain in effect (meaning  this test can be used) for the duration of  the COVID-19 declaration under Section 564(b)(1) of the Act, 21 U.S.C. section 360-bbb-3(b)(1), unless the authorization is terminated or revoked sooner.  Performed at [facility], [place] [address], [place], [place] [address]   MRSA PCR Screening     Status: None   Collection Time: 09/12/20  3:48 PM   Specimen: Nasal Mucosa; Nasopharyngeal  Result Value Ref Range Status   MRSA by PCR NEGATIVE NEGATIVE Final    Comment:        The GeneXpert MRSA Assay (FDA approved for NASAL specimens only), is one component of a comprehensive MRSA colonization surveillance program. It is not intended to diagnose MRSA infection nor to guide or monitor treatment for MRSA infections. Performed at [facility], [place] [address] [place], [place] [address]          Radiology Studies: No results found.      Scheduled Meds: . vitamin C  500 mg Oral Daily  . baricitinib  4 mg Oral Daily  . Chlorhexidine Gluconate Cloth  6 each Topical Daily  . enoxaparin (LOVENOX) injection  80 mg Subcutaneous Q24H  .  insulin aspart  0-20 Units Subcutaneous TID WC  . insulin aspart  0-5 Units Subcutaneous QHS  . methylPREDNISolone (SOLU-MEDROL) injection  0.5 mg/kg Intravenous Q12H   Followed by  . [START ON 09/14/2020] predniSONE  50 mg Oral Daily  . sodium chloride flush  3 mL Intravenous Q12H  . sodium chloride flush  3 mL Intravenous Q12H  . zinc sulfate  220 mg Oral Daily   Continuous Infusions: . sodium  chloride    . remdesivir 100 mg in NS 100 mL 100 mg (09/13/20 0956)     LOS: 3 days    Time spent: 30 minutes    Barb Merino, MD [facility] Pager [phone]

## 2020-09-14 LAB — CBC WITH DIFFERENTIAL/PLATELET
Abs Immature Granulocytes: 0.14 10*3/uL — ABNORMAL HIGH (ref 0.00–0.07)
Basophils Absolute: 0 10*3/uL (ref 0.0–0.1)
Basophils Relative: 0 %
Eosinophils Absolute: 0 10*3/uL (ref 0.0–0.5)
Eosinophils Relative: 0 %
HCT: 41.3 % (ref 36.0–46.0)
Hemoglobin: 12.3 g/dL (ref 12.0–15.0)
Immature Granulocytes: 2 %
Lymphocytes Relative: 13 %
Lymphs Abs: 1.2 10*3/uL (ref 0.7–4.0)
MCH: 29 pg (ref 26.0–34.0)
MCHC: 29.8 g/dL — ABNORMAL LOW (ref 30.0–36.0)
MCV: 97.4 fL (ref 80.0–100.0)
Monocytes Absolute: 1.3 10*3/uL — ABNORMAL HIGH (ref 0.1–1.0)
Monocytes Relative: 14 %
Neutro Abs: 6.8 10*3/uL (ref 1.7–7.7)
Neutrophils Relative %: 71 %
Platelets: 379 10*3/uL (ref 150–400)
RBC: 4.24 MIL/uL (ref 3.87–5.11)
RDW: 18.3 % — ABNORMAL HIGH (ref 11.5–15.5)
WBC: 9.5 10*3/uL (ref 4.0–10.5)
nRBC: 1.6 % — ABNORMAL HIGH (ref 0.0–0.2)

## 2020-09-14 LAB — COMPREHENSIVE METABOLIC PANEL
ALT: 48 U/L — ABNORMAL HIGH (ref 0–44)
AST: 49 U/L — ABNORMAL HIGH (ref 15–41)
Albumin: 3 g/dL — ABNORMAL LOW (ref 3.5–5.0)
Alkaline Phosphatase: 55 U/L (ref 38–126)
Anion gap: 10 (ref 5–15)
BUN: 25 mg/dL — ABNORMAL HIGH (ref 6–20)
CO2: 34 mmol/L — ABNORMAL HIGH (ref 22–32)
Calcium: 8.7 mg/dL — ABNORMAL LOW (ref 8.9–10.3)
Chloride: 100 mmol/L (ref 98–111)
Creatinine, Ser: 0.79 mg/dL (ref 0.44–1.00)
GFR calc Af Amer: >60 mL/min (ref >60)
GFR calc non Af Amer: >60 mL/min (ref >60)
Glucose, Bld: 120 mg/dL — ABNORMAL HIGH (ref 70–99)
Potassium: 4.3 mmol/L (ref 3.5–5.1)
Sodium: 144 mmol/L (ref 135–145)
Total Bilirubin: 0.5 mg/dL (ref 0.3–1.2)
Total Protein: 6.9 g/dL (ref 6.5–8.1)

## 2020-09-14 LAB — MAGNESIUM: Magnesium: 2.4 mg/dL (ref 1.7–2.4)

## 2020-09-14 LAB — GLUCOSE, CAPILLARY
Glucose-Capillary: 99 mg/dL (ref 70–99)
Glucose-Capillary: 134 mg/dL — ABNORMAL HIGH (ref 70–99)
Glucose-Capillary: 123 mg/dL — ABNORMAL HIGH (ref 70–99)

## 2020-09-14 LAB — FERRITIN: Ferritin: 30 ng/mL (ref 11–307)

## 2020-09-14 LAB — C-REACTIVE PROTEIN: CRP: 1.3 mg/dL — ABNORMAL HIGH (ref <1.0)

## 2020-09-14 LAB — D-DIMER, QUANTITATIVE: D-Dimer, Quant: 2.42 ug/mL-FEU — ABNORMAL HIGH (ref 0.00–0.50)

## 2020-09-14 LAB — PHOSPHORUS: Phosphorus: 2.8 mg/dL (ref 2.5–4.6)

## 2020-09-14 MED ORDER — HYDRALAZINE HCL 25 MG PO TABS: 25.0000 mg | ORAL_TABLET | Freq: Four times a day (QID) | ORAL | Status: DC | PRN

## 2020-09-14 NOTE — Progress Notes (Signed)
Physical Therapy Treatment Patient Details Name: Sophia Mendez MRN: [id number] DOB: 1976-11-21 Today's Date: 09/14/2020    History of Present Illness Patient is 44 y.o. female with PMH significant for chronic back and neck pain, and morbid obesity, sleep apnea, and obesity hypoventilation syndrome. Pt admitted with shortness of breath and cough.  Symptoms started ~ 1 week PTA and worsened for 2 to 3 days.  Pt unvaccinated and COVID-19 positive on 9/7 in [facility] ED; saturations 50%.  Started on high flow nasal cannula and nonrebreather.    PT Comments    Progressing with mobility. O2 80% on 15L HFNC with activity; 88% on 15L HFNC at end of session. Will continue to follow and progress activity as tolerated.    Follow Up Recommendations  Home health PT;Supervision - Intermittent     Equipment Recommendations   (continuing to assess-possibly bari RW)    Recommendations for Other Services       Precautions / Restrictions Precautions Precautions: Fall Precaution Comments: monitor vitals currently 15L HFNC    Mobility  Bed Mobility Overal bed mobility: Modified Independent                Transfers Overall transfer level: Needs assistance   Transfers: Sit to/from Stand Sit to Stand: Supervision            Ambulation/Gait Ambulation/Gait assistance: Min guard Gait Distance (Feet): 15 Feet Assistive device: IV Pole       General Gait Details: O2 80% on 15L HFNC with ambulation. Brief coughing fit.   Stairs             Wheelchair Mobility    Modified Rankin (Stroke Patients Only)       Balance Overall balance assessment: Needs assistance           Standing balance-Leahy Scale: Fair                              Cognition Arousal/Alertness: Awake/alert Behavior During Therapy: WFL for tasks assessed/performed Overall Cognitive Status: Within Functional Limits for tasks assessed                                 General  Comments: slow to respond at times      Exercises      General Comments        Pertinent Vitals/Pain Pain Assessment: No/denies pain    Home Living                      Prior Function            PT Goals (current goals can now be found in the care plan section) Progress towards PT goals: Progressing toward goals    Frequency    Min 3X/week      PT Plan Current plan remains appropriate    Co-evaluation              AM-PAC PT "6 Clicks" Mobility   Outcome Measure  Help needed turning from your back to your side while in a flat bed without using bedrails?: A Little Help needed moving from lying on your back to sitting on the side of a flat bed without using bedrails?: A Little Help needed moving to and from a bed to a chair (including a wheelchair)?: A Little Help needed standing up from a chair using your arms (  e.g., wheelchair or bedside chair)?: A Little Help needed to walk in hospital room?: A Little Help needed climbing 3-5 steps with a railing? : A Lot 6 Click Score: 17    End of Session Equipment Utilized During Treatment: Oxygen Activity Tolerance: Patient limited by fatigue Patient left: in bed;with call bell/phone within reach   PT Visit Diagnosis: Difficulty in walking, not elsewhere classified (R26.2);Unsteadiness on feet (R26.81);Muscle weakness (generalized) (M62.81)     Time: 8403-7543 PT Time Calculation (min) (ACUTE ONLY): 37 min  Charges:  $Gait Training: 23-37 mins                         Doreatha Massed, PT Acute Rehabilitation  Office: [phone] Pager: [phone]

## 2020-09-14 NOTE — Progress Notes (Signed)
Occupational Therapy Evaluation  Patient with functional deficits listed below impacting safety and independence with self care. Patient mod I with bed mobility, supervision with transfer to [facility] then recliner, min A with LB dressing due to fatigue and supervision for peri care. Patient desat to 84-85% when doffed O2 for g/h, requiring ~30 seconds recovery back to 91% on 15L HFNC. Continue to recommend acute OT services to work on patient activity tolerance and further education regarding energy conservation and breathing techniques in order to facilitate D/C to venue listed below.    09/14/20 0949  OT Visit Information  Assistance Needed +1  History of Present Illness Patient is 44 y.o. female with PMH significant for chronic back and neck pain, and morbid obesity, sleep apnea, and obesity hypoventilation syndrome. Pt admitted with shortness of breath and cough.  Symptoms started ~ 1 week PTA and worsened for 2 to 3 days.  Pt unvaccinated and COVID-19 positive on 9/7 in [facility] ED; saturations 50%.  Started on high flow nasal cannula and nonrebreather.  Precautions  Precautions Fall  Precaution Comments monitor vitals currently 15L HFNC  Home Living  Family/patient expects to be discharged to: Private residence  [place];Other relatives;Spouse/significant other  Available Help at Discharge Family  Type of [place] to enter  Entrance Stairs-Number of Steps 4  Entrance Stairs-Rails Can reach both  Home Layout Two level;Bed/bath upstairs;Able to live on main level with bedroom/bathroom;1/2 bath on main level  Alternate Level Stairs-Number of Steps 14  Alternate Level Stairs-Rails Left;Right  Bathroom Shower/Tub Network engineer None  Additional Comments pt reports she can stay on the main level   Prior Function  Level of Independence Independent  Communication  Communication No difficulties  Pain Assessment   Faces Pain Scale 4  Pain Location headache  Pain Descriptors / Indicators Aching  Cognition  Arousal/Alertness Awake/alert  Behavior During Therapy WFL for tasks assessed/performed  Overall Cognitive Status Within Functional Limits for tasks assessed  Cervical / Trunk Assessment  Cervical / Trunk Assessment Other exceptions  Cervical / Trunk Exceptions body habitus  ADL  Overall ADL's  Needs assistance/impaired  Eating/Feeding Independent;Sitting  Grooming Set up;Wash/dry face;Wash/dry hands;Sitting  Upper Body Bathing Set up;Sitting  Lower Body Bathing Minimal assistance;Sitting/lateral leans;Sit to/from stand  Upper Body Dressing  Set up;Sitting  Lower Body Dressing Minimal assistance;Sitting/lateral leans;Sit to/from stand  Lower Body Dressing Details (indicate cue type and reason) patient require assist to initiate threading brief due to fatigue  Toilet Transfer Supervision/safety;Stand-pivot;BSC  Toileting- Clothing Manipulation and Hygiene Supervision/safety;Sit to/from stand  Functional mobility during ADLs Supervision/safety  General ADL Comments patient requiring increased time and assistance with self care due to fatigue, decreased endurance, and pt's cardiopulomonary status requiring 15L HFNC  Bed Mobility  Overal bed mobility Modified Independent  Transfers  Overall transfer level Needs assistance  Equipment used None  Transfers Sit to/from Stand;Stand Pivot Transfers  Sit to Stand Supervision  Stand pivot transfers Supervision  General transfer comment decreased activity tolerance, requires rest breaks between sit to stands with peri care and transferring from [facility] to recliner, cues for pursed lip breathing  Balance  Overall balance assessment Needs assistance  Sitting-balance support Feet supported  Sitting balance-Leahy Scale Good  Standing balance support During functional activity;No upper extremity supported  Standing balance-Leahy Scale Good  General Comments   General comments (skin integrity, edema, etc.) patient doff O2 for ~20 seconds to wash face with desaturation  84-85%, patient require ~30 seconds to recover once O2 donned to return to 91-92%  Exercises  Exercises Other exercises  Other Exercises  Other Exercises educate patient on pursed lip breathing techniques during activity  OT - End of Session  Equipment Utilized During Treatment Oxygen  Activity Tolerance Patient limited by fatigue  Patient left in chair;with call bell/phone within reach  Nurse Communication Mobility status  OT Assessment  OT Recommendation/Assessment Patient needs continued OT Services  OT Visit Diagnosis Other abnormalities of gait and mobility (R26.89)  OT Problem List Decreased activity tolerance;Decreased safety awareness;Decreased knowledge of use of DME or AE;Cardiopulmonary status limiting activity;Obesity  OT Plan  OT Frequency (ACUTE ONLY) Min 2X/week  OT Treatment/Interventions (ACUTE ONLY) Self-care/ADL training;Therapeutic exercise;Energy conservation;DME and/or AE instruction;Therapeutic activities;Patient/family education;Balance training  AM-PAC OT "6 Clicks" Daily Activity Outcome Measure (Version 2)  Help from another person eating meals? 4  Help from another person taking care of personal grooming? 3  Help from another person toileting, which includes using toliet, bedpan, or urinal? 3  Help from another person bathing (including washing, rinsing, drying)? 3  Help from another person to put on and taking off regular upper body clothing? 3  Help from another person to put on and taking off regular lower body clothing? 3  6 Click Score 19  OT Recommendation  Follow Up Recommendations No OT follow up  OT Equipment Tub/shower seat  Individuals Consulted  Consulted and Agree with Results and Recommendations Patient  Acute Rehab OT Goals  Patient Stated Goal agreeable with up to chair for breakfast  OT Goal Formulation With patient  Time For  Goal Achievement 09/28/20  Potential to Achieve Goals Good  OT Time Calculation  OT Start Time (ACUTE ONLY) 0752  OT Stop Time (ACUTE ONLY) 0820  OT Time Calculation (min) 28 min  OT General Charges  $OT Visit 1 Visit  OT Evaluation  $OT Eval Low Complexity 1 Low  OT Treatments  $Self Care/Home Management  8-22 mins  Written Expression  Dominant Hand Right   Delbert Phenix OT OT pager: [phone]

## 2020-09-14 NOTE — Progress Notes (Signed)
PROGRESS NOTE    Sophia Mendez  MPN:[id number] DOB: 1976-06-16 DOA: 09/10/2020 PCP: Pcp, No    Brief Narrative:  44 year old female with history of chronic back and neck pain, morbid obesity admitted with shortness of breath and cough.  Symptoms started about 1 week, worse for 2 to 3 days.  In the emergency room COVID-19 positive on 9/7.  Saturations 50%.  Started on high flow nasal cannula and nonrebreather. Patient probably has underlying untreated sleep apnea and obesity hypoventilation syndrome.  She also has fibroid and bleeding from fibroid and has been looking forward for surgery. Patient is not vaccinated against COVID-19. Remains on high flow oxygen.   Assessment & Plan:   Active Problems:   Acute hypoxemic respiratory failure due to COVID-19 [facility])   Morbid obesity with BMI of 50.0-59.9, adult (HCC)  Acute hypoxemic respiratory failure due to COVID-19 viral infection:  Bilateral pneumonia due to COVID-19: Continue to monitor due to significant symptoms  chest physiotherapy, incentive spirometry, deep breathing exercises, sputum induction, mucolytic's and bronchodilators. Supplemental oxygen to keep saturations more than 85 %. Covid directed therapy with , steroids, Solu-Medrol, changed to prednisone high-dose. remdesivir, day 5/5 Baricitinib, I discussed in details with patient about use of baricitinib, good results in combination with remdesivir in a patient requiring more oxygen and recent trials as well as emergency use authorization by FDA.  Some side effects could be anticipated including bacterial infection.  Patient has no obvious contraindication to use baricitinib. Started on baricitinib with patient's consent.  Day 4/14. Due to severity of symptoms, patient will need daily inflammatory markers, chest x-rays, liver function test to monitor and direct COVID-19 therapies. ABG shows PCO2 less than 60, probably chronic.  COVID-19 Labs  Recent Labs    09/12/20 0235  09/13/20 0250 09/14/20 0254  DDIMER 1.00* 1.17* 2.42*  FERRITIN 46 38 30  CRP 6.7* 3.1* 1.3*    Lab Results  Component Value Date   SARSCOV2NAA POSITIVE (A) 09/10/2020    SpO2: 100 % O2 Flow Rate (L/min): 15 L/min  Morbid obesity: BMI more than 50.  Patient will definitely benefit with weight loss surgery.  She has outpatient follow-up. Has underlying untreated sleep apnea and obesity hypoventilation syndrome.  She is lethargic.  Will check blood gas today to see carbon oxide level.  Menorrhagia with fibroid uterus: Significant problem.  Hemoglobin is stable.  Once patient has improvement from her current respiratory status, will refer her to her gynecologist.  She has significant menorrhagia.  DVT prophylaxis: Lovenox.   Code Status: Full code Family Communication: Patient's husband on the phone. Disposition Plan: Status is: Inpatient  Remains inpatient appropriate because:Inpatient level of care appropriate due to severity of illness   Dispo: The patient is from: Home              Anticipated d/c is to: Home              Anticipated d/c date is: > 3 days, still on significant oxygen.              Patient currently is not medically stable to d/c.         Consultants:   None  Procedures:   None  Antimicrobials:  Antibiotics Given (last 72 hours)    Date/Time Action Medication Dose Rate   09/11/20 1120 New Bag/Given   remdesivir 100 mg in sodium chloride 0.9 % 100 mL IVPB 100 mg 200 mL/hr   09/12/20 0818 New Bag/Given   remdesivir 100  mg in sodium chloride 0.9 % 100 mL IVPB 100 mg 200 mL/hr   09/13/20 0956 New Bag/Given   remdesivir 100 mg in sodium chloride 0.9 % 100 mL IVPB 100 mg 200 mL/hr   09/14/20 0936 New Bag/Given   remdesivir 100 mg in sodium chloride 0.9 % 100 mL IVPB 100 mg 200 mL/hr         Subjective: Patient seen and examined.  Today she is more awake and alert and interactive.  She is more concerned about bleeding and wants to see if I  can have surgery for her.  Currently on 15 L of oxygen.  Unable to take deep breathing.  Appetite is fair.  Denies any nausea vomiting.  Objective: Vitals:   09/14/20 0300 09/14/20 0400 09/14/20 0823 09/14/20 0900  BP: (!) 157/79 (!) 163/100    Pulse: 80 71  71  Resp: (!) 31 (!) 25  18  Temp:  98.1 F (36.7 C) 98.1 F (36.7 C)   TempSrc:  Axillary Oral   SpO2: 94% 96%  100%  Weight:      Height:        Intake/Output Summary (Last 24 hours) at 09/14/2020 1020 Last data filed at 09/14/2020 0936 Gross per 24 hour  Intake 28.8 ml  Output --  Net 28.8 ml   Filed Weights   09/10/20 1538  Weight: (!) 154.5 kg    Examination:  General exam: Chronically sick looking, morbidly obese lady on 15 L of oxygen. Sitting in couch and eating breakfast.  In moderate respiratory distress. Respiratory system: Poor bilateral air entry.  No added sounds.  Difficult to auscultate due to obesity. Cardiovascular system: S1 & S2 heard, RRR. No JVD, murmurs, rubs, gallops or clicks. No pedal edema. Gastrointestinal system: Distended but not tender.  Obese and pendulous. Central nervous system: Alert and oriented. No focal neurological deficits. Extremities: Symmetric 5 x 5 power. Skin: No rashes, lesions or ulcers Psychiatry: Judgement and insight appear normal. Mood & affect flat and anxious.    Data Reviewed: I have personally reviewed following labs and imaging studies  CBC: Recent Labs  Lab 09/10/20 1209 09/11/20 0451 09/12/20 0235 09/13/20 0250 09/14/20 0254  WBC 7.6 12.1* 9.8 7.7 9.5  NEUTROABS 6.1 9.8* 7.4 5.8 6.8  HGB 10.8* 11.7* 11.6* 11.9* 12.3  HCT 35.6* 39.6 39.3 41.1 41.3  MCV 96.2 99.5 98.5 100.5* 97.4  PLT 345 352 363 332 962   Basic Metabolic Panel: Recent Labs  Lab 09/10/20 1209 09/11/20 0432 09/12/20 0235 09/13/20 0250 09/14/20 0254  NA 138 137 136 141 144  K 3.7 5.0 4.5 5.1 4.3  CL 95* 94* 96* 97* 100  CO2 29 31 32 35* 34*  GLUCOSE 164* 161* 135* 115* 120*   BUN 18 24* 32* 26* 25*  CREATININE 0.99 1.00 1.01* 0.95 0.79  CALCIUM 7.9* 8.0* 8.1* 8.5* 8.7*  MG  --  2.4 2.5* 2.7* 2.4  PHOS  --  4.9* 3.3 3.1 2.8   GFR: Estimated Creatinine Clearance: 134 mL/min (by C-G formula based on SCr of 0.79 mg/dL). Liver Function Tests: Recent Labs  Lab 09/10/20 1209 09/11/20 0432 09/12/20 0235 09/13/20 0250 09/14/20 0254  AST 41 66* 46* 52* 49*  ALT 23 30 29  41 48*  ALKPHOS 39 48 47 49 55  BILITOT 0.3 0.5 0.5 0.5 0.5  PROT 6.5 7.3 7.1 6.9 6.9  ALBUMIN 2.7* 3.0* 2.8* 2.8* 3.0*   No results for input(s): LIPASE, AMYLASE in the last 168  hours. No results for input(s): AMMONIA in the last 168 hours. Coagulation Profile: No results for input(s): INR, PROTIME in the last 168 hours. Cardiac Enzymes: No results for input(s): CKTOTAL, CKMB, CKMBINDEX, TROPONINI in the last 168 hours. BNP (last 3 results) No results for input(s): PROBNP in the last 8760 hours. HbA1C: No results for input(s): HGBA1C in the last 72 hours. CBG: Recent Labs  Lab 09/12/20 2128 09/13/20 0803 09/13/20 1135 09/13/20 2129 09/14/20 0749  GLUCAP 222* 114* 122* 123* 99   Lipid Profile: No results for input(s): CHOL, HDL, LDLCALC, TRIG, CHOLHDL, LDLDIRECT in the last 72 hours. Thyroid Function Tests: No results for input(s): TSH, T4TOTAL, FREET4, T3FREE, THYROIDAB in the last 72 hours. Anemia Panel: Recent Labs    09/13/20 0250 09/14/20 0254  FERRITIN 38 30   Sepsis Labs: Recent Labs  Lab 09/10/20 1209  PROCALCITON <0.10  LATICACIDVEN 1.8    Recent Results (from the past 240 hour(s))  Blood Culture (routine x 2)     Status: None (Preliminary result)   Collection Time: 09/10/20 12:09 PM   Specimen: BLOOD  Result Value Ref Range Status   Specimen Description   Final    BLOOD LEFT ANTECUBITAL Performed at [facility], [place] [address] [place], [place] [address]    Special Requests   Final    BOTTLES DRAWN AEROBIC AND ANAEROBIC Blood  Culture adequate volume Performed at [place] [address] [place], [place] [address]    Culture   Final    NO GROWTH 3 DAYS Performed at [facility], [place] [address], [place], [place] [address]    Report Status PENDING  Incomplete  Blood Culture (routine x 2)     Status: Abnormal   Collection Time: 09/10/20 12:25 PM   Specimen: BLOOD  Result Value Ref Range Status   Specimen Description   Final    BLOOD RIGHT ANTECUBITAL Performed at [facility] [address] [place], [place] [address]    Special Requests   Final    BOTTLES DRAWN AEROBIC AND ANAEROBIC Blood Culture adequate volume Performed at [facility] [address] [place], [place] [address]    Culture  Setup Time   Final    GRAM POSITIVE COCCI IN CLUSTERS AEROBIC BOTTLE ONLY CRITICAL RESULT CALLED TO, READ BACK BY AND VERIFIED WITH: PHARMD MICHELLE L. [phone] FCP    Culture (A)  Final    STAPHYLOCOCCUS HOMINIS THE SIGNIFICANCE OF ISOLATING THIS ORGANISM FROM A SINGLE SET OF BLOOD CULTURES WHEN MULTIPLE SETS ARE DRAWN IS UNCERTAIN. PLEASE NOTIFY THE MICROBIOLOGY DEPARTMENT WITHIN ONE WEEK IF SPECIATION AND SENSITIVITIES ARE REQUIRED. Performed at [facility], [place] [address], [place], [place] [address]    Report Status 09/12/2020 FINAL  Final  Blood Culture ID Panel (Reflexed)     Status: Abnormal   Collection Time: 09/10/20 12:25 PM  Result Value Ref Range Status   Enterococcus faecalis NOT DETECTED NOT DETECTED Final   Enterococcus Faecium NOT DETECTED NOT DETECTED Final   Listeria monocytogenes NOT DETECTED NOT DETECTED Final   Staphylococcus species DETECTED (A) NOT DETECTED Final    Comment: CRITICAL RESULT CALLED TO, READ BACK BY AND VERIFIED WITH: PHARMD MICHELLE L. [phone] FCP    Staphylococcus aureus (BCID) NOT DETECTED NOT DETECTED Final   Staphylococcus epidermidis NOT DETECTED NOT DETECTED Final   Staphylococcus  lugdunensis NOT DETECTED NOT DETECTED Final   Streptococcus species NOT DETECTED NOT  DETECTED Final   Streptococcus agalactiae NOT DETECTED NOT DETECTED Final   Streptococcus pneumoniae NOT DETECTED NOT DETECTED Final   Streptococcus pyogenes NOT DETECTED NOT DETECTED Final   A.calcoaceticus-baumannii NOT DETECTED NOT DETECTED Final   Bacteroides fragilis NOT DETECTED NOT DETECTED Final   Enterobacterales NOT DETECTED NOT DETECTED Final   Enterobacter cloacae complex NOT DETECTED NOT DETECTED Final   Escherichia coli NOT DETECTED NOT DETECTED Final   Klebsiella aerogenes NOT DETECTED NOT DETECTED Final   Klebsiella oxytoca NOT DETECTED NOT DETECTED Final   Klebsiella pneumoniae NOT DETECTED NOT DETECTED Final   Proteus species NOT DETECTED NOT DETECTED Final   Salmonella species NOT DETECTED NOT DETECTED Final   Serratia marcescens NOT DETECTED NOT DETECTED Final   Haemophilus influenzae NOT DETECTED NOT DETECTED Final   Neisseria meningitidis NOT DETECTED NOT DETECTED Final   Pseudomonas aeruginosa NOT DETECTED NOT DETECTED Final   Stenotrophomonas maltophilia NOT DETECTED NOT DETECTED Final   Candida albicans NOT DETECTED NOT DETECTED Final   Candida auris NOT DETECTED NOT DETECTED Final   Candida glabrata NOT DETECTED NOT DETECTED Final   Candida krusei NOT DETECTED NOT DETECTED Final   Candida parapsilosis NOT DETECTED NOT DETECTED Final   Candida tropicalis NOT DETECTED NOT DETECTED Final   Cryptococcus neoformans/gattii NOT DETECTED NOT DETECTED Final    Comment: Performed at [facility], [address], [place], [place] [address]  SARS Coronavirus 2 by RT PCR (hospital order, performed in [facility] hospital lab) Nasopharyngeal Nasopharyngeal Swab     Status: Abnormal   Collection Time: 09/10/20  2:40 PM   Specimen: Nasopharyngeal Swab  Result Value Ref Range Status   SARS Coronavirus 2 POSITIVE (A) NEGATIVE Final    Comment: RESULT CALLED TO, READ BACK BY AND VERIFIED  WITH: WEST,S. RN @1638  ON 09.14.2021 BY COHEN,K (NOTE) SARS-CoV-2 target nucleic acids are DETECTED  SARS-CoV-2 RNA is generally detectable in upper respiratory specimens  during the acute phase of infection.  Positive results are indicative  of the presence of the identified virus, but do not rule out bacterial infection or co-infection with other pathogens not detected by the test.  Clinical correlation with patient history and  other diagnostic information is necessary to determine patient infection status.  The expected result is negative.  Fact Sheet for Patients:   StrictlyIdeas.no   Fact Sheet for Healthcare Providers:   BankingDealers.co.za    This test is not yet approved or cleared by the [place] FDA and  has been authorized for detection and/or diagnosis of SARS-CoV-2 by FDA under an Emergency Use Authorization (EUA).  This EUA will remain in effect (meaning  this test can be used) for the duration of  the COVID-19 declaration under Section 564(b)(1) of the Act, 21 U.S.C. section 360-bbb-3(b)(1), unless the authorization is terminated or revoked sooner.  Performed at [facility], [place] [address] [place], [place] [address]   MRSA PCR Screening     Status: None   Collection Time: 09/12/20  3:48 PM   Specimen: Nasal Mucosa; Nasopharyngeal  Result Value Ref Range Status   MRSA by PCR NEGATIVE NEGATIVE Final    Comment:        The GeneXpert MRSA Assay (FDA approved for NASAL specimens only), is one component of a comprehensive MRSA colonization surveillance program. It is not intended to diagnose MRSA infection nor to guide or monitor treatment for MRSA infections. Performed at [facility], [place] [address] [place], [place]  [place]          Radiology Studies: No results found.      Scheduled Meds: . vitamin C  500 mg Oral Daily  . baricitinib  4 mg Oral  Daily  . Chlorhexidine Gluconate Cloth  6 each Topical Daily  . enoxaparin (LOVENOX) injection  80 mg Subcutaneous Q24H  . insulin aspart  0-20 Units Subcutaneous TID WC  . insulin aspart  0-5 Units Subcutaneous QHS  . predniSONE  50 mg Oral Daily  . sodium chloride flush  3 mL Intravenous Q12H  . sodium chloride flush  3 mL Intravenous Q12H  . zinc sulfate  220 mg Oral Daily   Continuous Infusions: . sodium chloride       LOS: 4 days    Time spent: 30 minutes    Barb Merino, MD [facility] Pager [phone]

## 2020-09-15 ENCOUNTER — Inpatient Hospital Stay (HOSPITAL_COMMUNITY): Payer: HRSA Program

## 2020-09-15 LAB — CBC WITH DIFFERENTIAL/PLATELET
Abs Immature Granulocytes: 0.14 10*3/uL — ABNORMAL HIGH (ref 0.00–0.07)
Basophils Absolute: 0 10*3/uL (ref 0.0–0.1)
Basophils Relative: 0 %
Eosinophils Absolute: 0 10*3/uL (ref 0.0–0.5)
Eosinophils Relative: 0 %
HCT: 37.8 % (ref 36.0–46.0)
Hemoglobin: 11.5 g/dL — ABNORMAL LOW (ref 12.0–15.0)
Immature Granulocytes: 2 %
Lymphocytes Relative: 28 %
Lymphs Abs: 2.6 10*3/uL (ref 0.7–4.0)
MCH: 29.2 pg (ref 26.0–34.0)
MCHC: 30.4 g/dL (ref 30.0–36.0)
MCV: 95.9 fL (ref 80.0–100.0)
Monocytes Absolute: 1 10*3/uL (ref 0.1–1.0)
Monocytes Relative: 10 %
Neutro Abs: 5.6 10*3/uL (ref 1.7–7.7)
Neutrophils Relative %: 60 %
Platelets: 393 10*3/uL (ref 150–400)
RBC: 3.94 MIL/uL (ref 3.87–5.11)
RDW: 18 % — ABNORMAL HIGH (ref 11.5–15.5)
WBC: 9.4 10*3/uL (ref 4.0–10.5)
nRBC: 1.2 % — ABNORMAL HIGH (ref 0.0–0.2)

## 2020-09-15 LAB — COMPREHENSIVE METABOLIC PANEL
ALT: 48 U/L — ABNORMAL HIGH (ref 0–44)
AST: 38 U/L (ref 15–41)
Albumin: 3.2 g/dL — ABNORMAL LOW (ref 3.5–5.0)
Alkaline Phosphatase: 58 U/L (ref 38–126)
Anion gap: 14 (ref 5–15)
BUN: 18 mg/dL (ref 6–20)
CO2: 36 mmol/L — ABNORMAL HIGH (ref 22–32)
Calcium: 9.1 mg/dL (ref 8.9–10.3)
Chloride: 97 mmol/L — ABNORMAL LOW (ref 98–111)
Creatinine, Ser: 0.74 mg/dL (ref 0.44–1.00)
GFR calc Af Amer: >60 mL/min (ref >60)
GFR calc non Af Amer: >60 mL/min (ref >60)
Glucose, Bld: 86 mg/dL (ref 70–99)
Potassium: 3.9 mmol/L (ref 3.5–5.1)
Sodium: 147 mmol/L — ABNORMAL HIGH (ref 135–145)
Total Bilirubin: 0.6 mg/dL (ref 0.3–1.2)
Total Protein: 7.2 g/dL (ref 6.5–8.1)

## 2020-09-15 LAB — CULTURE, BLOOD (ROUTINE X 2)
Culture: NO GROWTH
Special Requests: ADEQUATE

## 2020-09-15 LAB — C-REACTIVE PROTEIN: CRP: 0.9 mg/dL (ref <1.0)

## 2020-09-15 LAB — MAGNESIUM: Magnesium: 1.9 mg/dL (ref 1.7–2.4)

## 2020-09-15 LAB — FERRITIN: Ferritin: 34 ng/mL (ref 11–307)

## 2020-09-15 LAB — D-DIMER, QUANTITATIVE: D-Dimer, Quant: 3.62 ug/mL-FEU — ABNORMAL HIGH (ref 0.00–0.50)

## 2020-09-15 LAB — PHOSPHORUS: Phosphorus: 3.3 mg/dL (ref 2.5–4.6)

## 2020-09-15 MED ORDER — LIP MEDEX EX OINT
TOPICAL_OINTMENT | CUTANEOUS | Status: DC | PRN
  Filled 2020-09-15: qty 7

## 2020-09-15 MED ORDER — FUROSEMIDE 10 MG/ML IJ SOLN
40.0000 mg | Freq: Once | INTRAMUSCULAR | Status: AC
  Administered 2020-09-15: 40 mg via INTRAVENOUS
  Filled 2020-09-15: qty 4

## 2020-09-15 MED ORDER — METHYLPREDNISOLONE SODIUM SUCC 125 MG IJ SOLR
60.0000 mg | Freq: Two times a day (BID) | INTRAMUSCULAR | Status: DC
  Administered 2020-09-15 – 2020-09-17 (×5): 60 mg via INTRAVENOUS
  Filled 2020-09-15 (×5): qty 2

## 2020-09-15 MED ORDER — IOHEXOL 350 MG/ML SOLN
100.0000 mL | Freq: Once | INTRAVENOUS | Status: AC | PRN
  Administered 2020-09-15: 100 mL via INTRAVENOUS

## 2020-09-15 NOTE — Progress Notes (Signed)
PROGRESS NOTE    Sophia Mendez  UVO:[id number] DOB: 12-May-1976 DOA: 09/10/2020 PCP: Pcp, No    Brief Narrative:  44 year old female with history of chronic back and neck pain, morbid obesity admitted with shortness of breath and cough.  Symptoms started about 1 week, worse for 2 to 3 days.  In the emergency room COVID-19 positive on 9/7.  Saturations 50%.  Started on high flow nasal cannula and nonrebreather. Patient probably has underlying untreated sleep apnea and obesity hypoventilation syndrome.  She also has fibroid and bleeding from fibroid and has been looking forward for surgery. Patient is not vaccinated against COVID-19. Remains on high flow oxygen with poor recovery because of underlying severe comorbid conditions.   Assessment & Plan:   Active Problems:   Acute hypoxemic respiratory failure due to COVID-19 [facility])   Morbid obesity with BMI of 50.0-59.9, adult (HCC)  Acute hypoxemic respiratory failure due to COVID-19 viral infection:  Bilateral pneumonia due to COVID-19: Continue to monitor due to significant symptoms  chest physiotherapy, incentive spirometry, deep breathing exercises, sputum induction, mucolytic's and bronchodilators. Supplemental oxygen to keep saturations more than 85 %. Covid directed therapy with , steroids, Solu-Medrol, will keep patient on high-dose Solu-Medrol because of very high oxygen requirement. remdesivir, day 5/5 Baricitinib, I discussed in details with patient about use of baricitinib, good results in combination with remdesivir in a patient requiring more oxygen and recent trials as well as emergency use authorization by FDA.  Some side effects could be anticipated including bacterial infection.  Patient has no obvious contraindication to use baricitinib. Started on baricitinib with patient's consent.  Day 5/14. Due to severity of symptoms, patient will need daily inflammatory markers, chest x-rays, liver function test to monitor and direct  COVID-19 therapies. ABG shows PCO2 less than 60, probably chronic. Patient has underlying untreated sleep apnea, obesity hypoventilation and that will make her recovery difficult. Her creatinine is a stable, D-dimers trending up.  Will check CTA of the chest and lower extremity duplexes. Intermittent Lasix.  COVID-19 Labs  Recent Labs    09/13/20 0250 09/14/20 0254 09/15/20 0331  DDIMER 1.17* 2.42* 3.62*  FERRITIN 38 30 34  CRP 3.1* 1.3* 0.9    Lab Results  Component Value Date   SARSCOV2NAA POSITIVE (A) 09/10/2020    SpO2: 100 % O2 Flow Rate (L/min): 15 L/min  Morbid obesity: BMI more than 50.  Patient will definitely benefit with weight loss surgery.  She has outpatient follow-up. Has underlying untreated sleep apnea and obesity hypoventilation syndrome.   Hypercarbia is compensated, will need close monitoring.  Menorrhagia with fibroid uterus: Significant problem.  Hemoglobin is stable.  Once patient has improvement from her current respiratory status, will refer her to her gynecologist.  She has significant menorrhagia.  Sinus bradycardia: Patient had sinus bradycardia overnight when she was sleeping.  Probably due to underlying untreated sleep apnea.  Not on any rate control medications.  Will monitor.  DVT prophylaxis: Lovenox.   Code Status: Full code Family Communication: Patient's husband on the phone 9/18. Disposition Plan: Status is: Inpatient  Remains inpatient appropriate because:Inpatient level of care appropriate due to severity of illness   Dispo: The patient is from: Home              Anticipated d/c is to: Home              Anticipated d/c date is: > 3 days, still on significant oxygen.  Patient currently is not medically stable to d/c.         Consultants:   None  Procedures:   None  Antimicrobials:  Antibiotics Given (last 72 hours)    Date/Time Action Medication Dose Rate   09/13/20 0956 New Bag/Given   remdesivir 100  mg in sodium chloride 0.9 % 100 mL IVPB 100 mg 200 mL/hr   09/14/20 0936 New Bag/Given   remdesivir 100 mg in sodium chloride 0.9 % 100 mL IVPB 100 mg 200 mL/hr         Subjective: Patient seen and examined.  As usual she is less interactive.  Remains on 15 L overnight.  Looks lethargic.  She says she is doing fine. Objective: Vitals:   09/15/20 0756 09/15/20 0800 09/15/20 0900 09/15/20 1000  BP:  (!) 173/92 (!) 150/70 (!) 154/71  Pulse:  71 76 63  Resp:  (!) 25 (!) 23 (!) 26  Temp: 98 F (36.7 C)     TempSrc: Axillary     SpO2:  90% 96% 100%  Weight:      Height:        Intake/Output Summary (Last 24 hours) at 09/15/2020 1101 Last data filed at 09/15/2020 0945 Gross per 24 hour  Intake 200 ml  Output --  Net 200 ml   Filed Weights   09/10/20 1538  Weight: (!) 154.5 kg    Examination:  General exam: Chronically sick looking, morbidly obese lady on 15 L of oxygen. Lethargic, she is napping in between interview.  Difficult to keep awake. Respiratory system: Poor bilateral air entry.  No added sounds.  Difficult to auscultate due to obesity. Cardiovascular system: S1 & S2 heard, RRR. No JVD, murmurs, rubs, gallops or clicks. No pedal edema. Gastrointestinal system: Distended but not tender.  Obese and pendulous. Central nervous system: Alert and oriented. No focal neurological deficits. Extremities: Symmetric 5 x 5 power. Skin: No rashes, lesions or ulcers Psychiatry: Judgement and insight appear normal. Mood & affect flat and anxious.    Data Reviewed: I have personally reviewed following labs and imaging studies  CBC: Recent Labs  Lab 09/11/20 0451 09/12/20 0235 09/13/20 0250 09/14/20 0254 09/15/20 0331  WBC 12.1* 9.8 7.7 9.5 9.4  NEUTROABS 9.8* 7.4 5.8 6.8 5.6  HGB 11.7* 11.6* 11.9* 12.3 11.5*  HCT 39.6 39.3 41.1 41.3 37.8  MCV 99.5 98.5 100.5* 97.4 95.9  PLT 352 363 332 379 786   Basic Metabolic Panel: Recent Labs  Lab 09/11/20 0432 09/12/20 0235  09/13/20 0250 09/14/20 0254 09/15/20 0331  NA 137 136 141 144 147*  K 5.0 4.5 5.1 4.3 3.9  CL 94* 96* 97* 100 97*  CO2 31 32 35* 34* 36*  GLUCOSE 161* 135* 115* 120* 86  BUN 24* 32* 26* 25* 18  CREATININE 1.00 1.01* 0.95 0.79 0.74  CALCIUM 8.0* 8.1* 8.5* 8.7* 9.1  MG 2.4 2.5* 2.7* 2.4 1.9  PHOS 4.9* 3.3 3.1 2.8 3.3   GFR: Estimated Creatinine Clearance: 134 mL/min (by C-G formula based on SCr of 0.74 mg/dL). Liver Function Tests: Recent Labs  Lab 09/11/20 0432 09/12/20 0235 09/13/20 0250 09/14/20 0254 09/15/20 0331  AST 66* 46* 52* 49* 38  ALT 30 29 41 48* 48*  ALKPHOS 48 47 49 55 58  BILITOT 0.5 0.5 0.5 0.5 0.6  PROT 7.3 7.1 6.9 6.9 7.2  ALBUMIN 3.0* 2.8* 2.8* 3.0* 3.2*   No results for input(s): LIPASE, AMYLASE in the last 168 hours. No results for  input(s): AMMONIA in the last 168 hours. Coagulation Profile: No results for input(s): INR, PROTIME in the last 168 hours. Cardiac Enzymes: No results for input(s): CKTOTAL, CKMB, CKMBINDEX, TROPONINI in the last 168 hours. BNP (last 3 results) No results for input(s): PROBNP in the last 8760 hours. HbA1C: No results for input(s): HGBA1C in the last 72 hours. CBG: Recent Labs  Lab 09/13/20 1135 09/13/20 2129 09/14/20 0749 09/14/20 1122 09/14/20 1555  GLUCAP 122* 123* 99 134* 123*   Lipid Profile: No results for input(s): CHOL, HDL, LDLCALC, TRIG, CHOLHDL, LDLDIRECT in the last 72 hours. Thyroid Function Tests: No results for input(s): TSH, T4TOTAL, FREET4, T3FREE, THYROIDAB in the last 72 hours. Anemia Panel: Recent Labs    09/14/20 0254 09/15/20 0331  FERRITIN 30 34   Sepsis Labs: Recent Labs  Lab 09/10/20 1209  PROCALCITON <0.10  LATICACIDVEN 1.8    Recent Results (from the past 240 hour(s))  Blood Culture (routine x 2)     Status: None (Preliminary result)   Collection Time: 09/10/20 12:09 PM   Specimen: BLOOD  Result Value Ref Range Status   Specimen Description   Final    BLOOD LEFT  ANTECUBITAL Performed at [facility], [place] [address], [place], [place] [address]    Special Requests   Final    BOTTLES DRAWN AEROBIC AND ANAEROBIC Blood Culture adequate volume Performed at [place] [address], [place], [place] [address]    Culture   Final    NO GROWTH 4 DAYS Performed at [facility], [place] [address] [place], [place] [address]    Report Status PENDING  Incomplete  Blood Culture (routine x 2)     Status: Abnormal   Collection Time: 09/10/20 12:25 PM   Specimen: BLOOD  Result Value Ref Range Status   Specimen Description   Final    BLOOD RIGHT ANTECUBITAL Performed at [place] [address] [place], [place] [address]    Special Requests   Final    BOTTLES DRAWN AEROBIC AND ANAEROBIC Blood Culture adequate volume Performed at [place] [address], [place], [place] [address]    Culture  Setup Time   Final    GRAM POSITIVE COCCI IN CLUSTERS AEROBIC BOTTLE ONLY CRITICAL RESULT CALLED TO, READ BACK BY AND VERIFIED WITH: PHARMD MICHELLE L. [id number] FCP    Culture (A)  Final    STAPHYLOCOCCUS HOMINIS THE SIGNIFICANCE OF ISOLATING THIS ORGANISM FROM A SINGLE SET OF BLOOD CULTURES WHEN MULTIPLE SETS ARE DRAWN IS UNCERTAIN. PLEASE NOTIFY THE MICROBIOLOGY DEPARTMENT WITHIN ONE WEEK IF SPECIATION AND SENSITIVITIES ARE REQUIRED. Performed at [facility], [place] [address] [place], [place] [address]    Report Status 09/12/2020 FINAL  Final  Blood Culture ID Panel (Reflexed)     Status: Abnormal   Collection Time: 09/10/20 12:25 PM  Result Value Ref Range Status   Enterococcus faecalis NOT DETECTED NOT DETECTED Final   Enterococcus Faecium NOT DETECTED NOT DETECTED Final   Listeria monocytogenes NOT DETECTED NOT DETECTED Final   Staphylococcus species DETECTED (A) NOT DETECTED Final    Comment: CRITICAL RESULT CALLED TO, READ BACK BY AND VERIFIED WITH: PHARMD  MICHELLE L. [phone] [id number] FCP    Staphylococcus aureus (BCID) NOT DETECTED NOT DETECTED Final   Staphylococcus epidermidis NOT DETECTED NOT DETECTED Final   Staphylococcus lugdunensis NOT DETECTED NOT DETECTED Final   Streptococcus species NOT DETECTED NOT DETECTED Final  Streptococcus agalactiae NOT DETECTED NOT DETECTED Final   Streptococcus pneumoniae NOT DETECTED NOT DETECTED Final   Streptococcus pyogenes NOT DETECTED NOT DETECTED Final   A.calcoaceticus-baumannii NOT DETECTED NOT DETECTED Final   Bacteroides fragilis NOT DETECTED NOT DETECTED Final   Enterobacterales NOT DETECTED NOT DETECTED Final   Enterobacter cloacae complex NOT DETECTED NOT DETECTED Final   Escherichia coli NOT DETECTED NOT DETECTED Final   Klebsiella aerogenes NOT DETECTED NOT DETECTED Final   Klebsiella oxytoca NOT DETECTED NOT DETECTED Final   Klebsiella pneumoniae NOT DETECTED NOT DETECTED Final   Proteus species NOT DETECTED NOT DETECTED Final   Salmonella species NOT DETECTED NOT DETECTED Final   Serratia marcescens NOT DETECTED NOT DETECTED Final   Haemophilus influenzae NOT DETECTED NOT DETECTED Final   Neisseria meningitidis NOT DETECTED NOT DETECTED Final   Pseudomonas aeruginosa NOT DETECTED NOT DETECTED Final   Stenotrophomonas maltophilia NOT DETECTED NOT DETECTED Final   Candida albicans NOT DETECTED NOT DETECTED Final   Candida auris NOT DETECTED NOT DETECTED Final   Candida glabrata NOT DETECTED NOT DETECTED Final   Candida krusei NOT DETECTED NOT DETECTED Final   Candida parapsilosis NOT DETECTED NOT DETECTED Final   Candida tropicalis NOT DETECTED NOT DETECTED Final   Cryptococcus neoformans/gattii NOT DETECTED NOT DETECTED Final    Comment: Performed at [facility], [address], [place], [place] [address]  SARS Coronavirus 2 by RT PCR (hospital order, performed in [facility] hospital lab) Nasopharyngeal Nasopharyngeal Swab     Status: Abnormal   Collection Time: 09/10/20  2:40  PM   Specimen: Nasopharyngeal Swab  Result Value Ref Range Status   SARS Coronavirus 2 POSITIVE (A) NEGATIVE Final    Comment: RESULT CALLED TO, READ BACK BY AND VERIFIED WITH: WEST,S. RN @1638  ON 09.14.2021 BY COHEN,K (NOTE) SARS-CoV-2 target nucleic acids are DETECTED  SARS-CoV-2 RNA is generally detectable in upper respiratory specimens  during the acute phase of infection.  Positive results are indicative  of the presence of the identified virus, but do not rule out bacterial infection or co-infection with other pathogens not detected by the test.  Clinical correlation with patient history and  other diagnostic information is necessary to determine patient infection status.  The expected result is negative.  Fact Sheet for Patients:   StrictlyIdeas.no   Fact Sheet for Healthcare Providers:   BankingDealers.co.za    This test is not yet approved or cleared by the [place] FDA and  has been authorized for detection and/or diagnosis of SARS-CoV-2 by FDA under an Emergency Use Authorization (EUA).  This EUA will remain in effect (meaning  this test can be used) for the duration of  the COVID-19 declaration under Section 564(b)(1) of the Act, 21 U.S.C. section 360-bbb-3(b)(1), unless the authorization is terminated or revoked sooner.  Performed at [facility], [place] [address] [place], [place] [address]   MRSA PCR Screening     Status: None   Collection Time: 09/12/20  3:48 PM   Specimen: Nasal Mucosa; Nasopharyngeal  Result Value Ref Range Status   MRSA by PCR NEGATIVE NEGATIVE Final    Comment:        The GeneXpert MRSA Assay (FDA approved for NASAL specimens only), is one component of a comprehensive MRSA colonization surveillance program. It is not intended to diagnose MRSA infection nor to guide or monitor treatment for MRSA infections. Performed at [facility], [place]  [address] [place],  [address]  Radiology Studies: No results found.      Scheduled Meds: . vitamin C  500 mg Oral Daily  . baricitinib  4 mg Oral Daily  . Chlorhexidine Gluconate Cloth  6 each Topical Daily  . enoxaparin (LOVENOX) injection  80 mg Subcutaneous Q24H  . furosemide  40 mg Intravenous Once  . insulin aspart  0-20 Units Subcutaneous TID WC  . insulin aspart  0-5 Units Subcutaneous QHS  . methylPREDNISolone (SOLU-MEDROL) injection  60 mg Intravenous Q12H  . sodium chloride flush  3 mL Intravenous Q12H  . sodium chloride flush  3 mL Intravenous Q12H  . zinc sulfate  220 mg Oral Daily   Continuous Infusions: . sodium chloride       LOS: 5 days    Time spent: 30 minutes    Barb Merino, MD [facility] Pager [phone]

## 2020-09-16 ENCOUNTER — Inpatient Hospital Stay (HOSPITAL_COMMUNITY): Payer: HRSA Program

## 2020-09-16 DIAGNOSIS — M7989 Other specified soft tissue disorders: Secondary | ICD-10-CM

## 2020-09-16 LAB — GLUCOSE, CAPILLARY
Glucose-Capillary: 149 mg/dL — ABNORMAL HIGH (ref 70–99)
Glucose-Capillary: 120 mg/dL — ABNORMAL HIGH (ref 70–99)
Glucose-Capillary: 73 mg/dL (ref 70–99)
Glucose-Capillary: 82 mg/dL (ref 70–99)
Glucose-Capillary: 124 mg/dL — ABNORMAL HIGH (ref 70–99)
Glucose-Capillary: 185 mg/dL — ABNORMAL HIGH (ref 70–99)
Glucose-Capillary: 137 mg/dL — ABNORMAL HIGH (ref 70–99)
Glucose-Capillary: 139 mg/dL — ABNORMAL HIGH (ref 70–99)
Glucose-Capillary: 163 mg/dL — ABNORMAL HIGH (ref 70–99)
Glucose-Capillary: 83 mg/dL (ref 70–99)
Glucose-Capillary: 81 mg/dL (ref 70–99)

## 2020-09-16 NOTE — Progress Notes (Signed)
Lower extremity venous bilateral study completed.   Please see CV Proc for preliminary results.   Sophia Mendez  

## 2020-09-16 NOTE — TOC Initial Note (Signed)
Transition of Care [facility]) - Initial/Assessment Note    Patient Details  Name: Unnamed Hino MRN: [id number] Date of Birth: 1976-01-01  Transition of Care [facility]) CM/SW Contact:    Leeroy Cha, RN Phone Number: 09/16/2020, 11:35 AM  Clinical Narrative:                 Covid+ unvaccinated female, hfnc at 13l/min, iv solu medrol Plan following for progression and toc needs. Expected Discharge Plan: [place] Barriers to Discharge: Continued Medical Work up   Patient Goals and CMS Choice Patient states their goals for this hospitalization and ongoing recovery are:: to go home CMS Medicare.gov Compare Post Acute Care list provided to:: Patient    Expected Discharge Plan and Services Expected Discharge Plan: [place]   Discharge Planning Services: CM Consult   Living arrangements for the past 2 months: Single Family Home                                      Prior Living Arrangements/Services Living arrangements for the past 2 months: Single Family Home Lives with:: Spouse Patient language and need for interpreter reviewed:: Yes Do you feel safe going back to the place where you live?: Yes      Need for Family Participation in Patient Care: Yes (Comment) Care giver support system in place?: Yes (comment)   Criminal Activity/Legal Involvement Pertinent to Current Situation/Hospitalization: No - Comment as needed  Activities of Daily Living Home Assistive Devices/Equipment: None ADL Screening (condition at time of admission) Patient's cognitive ability adequate to safely complete daily activities?: Yes Is the patient deaf or have difficulty hearing?: No Does the patient have difficulty seeing, even when wearing glasses/contacts?: No Does the patient have difficulty concentrating, remembering, or making decisions?: No Patient able to express need for assistance with ADLs?: Yes Does the patient have difficulty dressing or  bathing?: Yes Independently performs ADLs?: No Communication: Independent Dressing (OT): Needs assistance Is this a change from baseline?: Change from baseline, expected to last >3 days Grooming: Needs assistance Is this a change from baseline?: Change from baseline, expected to last >3 days Feeding: Needs assistance Is this a change from baseline?: Change from baseline, expected to last >3 days Bathing: Needs assistance Is this a change from baseline?: Change from baseline, expected to last >3 days Toileting: Needs assistance Is this a change from baseline?: Change from baseline, expected to last >3days In/Out Bed: Needs assistance Is this a change from baseline?: Change from baseline, expected to last >3 days Walks in Home: Needs assistance Is this a change from baseline?: Change from baseline, expected to last >3 days Does the patient have difficulty walking or climbing stairs?: Yes (secondary to weakness and shortness of breath) Weakness of Legs: Both Weakness of Arms/Hands: None  Permission Sought/Granted                  Emotional Assessment Appearance:: Appears stated age     Orientation: : Oriented to Self, Oriented to Place, Oriented to  Time, Oriented to Situation Alcohol / Substance Use: Not Applicable Psych Involvement: No (comment)  Admission diagnosis:  Acute respiratory failure with hypoxia ([place]) [J96.01] Acute hypoxemic respiratory failure due to COVID-19 ([place]) [U07.1, J96.01] Pneumonia due to COVID-19 virus [U07.1, J12.82] Patient Active Problem List   Diagnosis Date Noted  . Acute hypoxemic respiratory failure due to COVID-19 ([place]) 09/10/2020  .  Morbid obesity with BMI of 50.0-59.9, adult [facility])    PCP:  Pcp, No Pharmacy:   [facility] [phone] [place] [place] [address] Phone: [phone] Fax: [phone]     Social Determinants of Health (SDOH) Interventions     Readmission Risk Interventions No flowsheet data found.

## 2020-09-16 NOTE — Progress Notes (Signed)
Physical Therapy Treatment Patient Details Name: Sophia Mendez MRN: [id number] DOB: 07-18-1976 Today's Date: 09/16/2020    History of Present Illness Patient is 44 y.o. female with PMH significant for chronic back and neck pain, and morbid obesity, sleep apnea, and obesity hypoventilation syndrome. Pt admitted with shortness of breath and cough.  Symptoms started ~ 1 week PTA and worsened for 2 to 3 days.  Pt unvaccinated and COVID-19 positive on 9/7 in [facility] ED; saturations 50%.  Started on high flow nasal cannula and nonrebreather.    PT Comments    Patient making good progress with acute PT and demonstrated improved activity tolerance today. She ambulated ~60' on 10L/min and sats remained >88% with short drop to 86% while recovering after gait and improved to 90% after 1 minute rest on 11L/min HFNC. Patient also completed two rounds of Sit<>Stands with sats remaining in 90's and HR increase from 60's to 80-90 bpm. Pt will continue to benefit from skilled PT interventions, acute will progress as able.    Follow Up Recommendations  Home health PT;Supervision - Intermittent     Equipment Recommendations  Other (comment);Rolling walker with 5" wheels (TBA, bariatric RW)    Recommendations for Other Services       Precautions / Restrictions Precautions Precautions: Fall Precaution Comments: monitor vitals currently 11L HFNC (was 15L) Restrictions Weight Bearing Restrictions: No    Mobility  Bed Mobility Overal bed mobility: Modified Independent             General bed mobility comments: pt OOB in recliner and ended in recliner.  Transfers Overall transfer level: Needs assistance Equipment used: None Transfers: Sit to/from [facility] Sit to Stand: Supervision Stand pivot transfers: Supervision       General transfer comment: pt recieved on BSC and required min guard for sit to stands and stand pivot to recliner. pt with improved activity tolerance today  completed repeated sit<>stands for functional strengthening.   Ambulation/Gait Ambulation/Gait assistance: Min guard;Supervision Gait Distance (Feet): 60 Feet Assistive device: IV Pole Gait Pattern/deviations: Step-through pattern;Decreased step length - right;Decreased step length - left;Decreased stride length;Wide base of support Gait velocity: decr   General Gait Details: pt on 11L/min at start of session and on 10L/min with activity. Pt sats increased to 98-100% initially with standing and gait. Pt talking to husband on phone and sats decreased to 88-90% during gait and 86% when sitting and recovering after gait. SpO2 returned to 94% on 11L/min after ~1 minute rest and pursed lip breathing.    Stairs             Wheelchair Mobility    Modified Rankin (Stroke Patients Only)       Balance Overall balance assessment: Needs assistance Sitting-balance support: Feet supported Sitting balance-Leahy Scale: Good     Standing balance support: During functional activity;No upper extremity supported Standing balance-Leahy Scale: Fair               Cognition Arousal/Alertness: Awake/alert Behavior During Therapy: WFL for tasks assessed/performed Overall Cognitive Status: Within Functional Limits for tasks assessed         Exercises Other Exercises Other Exercises: 2x10 reps sit<>stand no UE use for power up.  Other Exercises: 2x5 reps pursed lip breathing Other Exercises: 1x5 reps IS (pt with coughing fits after firs two attempts)    General Comments        Pertinent Vitals/Pain Pain Assessment: No/denies pain           PT Goals (  current goals can now be found in the care plan section) Acute Rehab PT Goals PT Goal Formulation: With patient Time For Goal Achievement: 09/27/20 Potential to Achieve Goals: Good Progress towards PT goals: Progressing toward goals    Frequency    Min 3X/week      PT Plan Current plan remains appropriate        AM-PAC PT "6 Clicks" Mobility   Outcome Measure  Help needed turning from your back to your side while in a flat bed without using bedrails?: A Little Help needed moving from lying on your back to sitting on the side of a flat bed without using bedrails?: A Little Help needed moving to and from a bed to a chair (including a wheelchair)?: A Little Help needed standing up from a chair using your arms (e.g., wheelchair or bedside chair)?: A Little Help needed to walk in hospital room?: A Little Help needed climbing 3-5 steps with a railing? : A Lot 6 Click Score: 17    End of Session Equipment Utilized During Treatment: Oxygen Activity Tolerance: Patient tolerated treatment well Patient left: with call bell/phone within reach;in chair Nurse Communication: Mobility status PT Visit Diagnosis: Difficulty in walking, not elsewhere classified (R26.2);Unsteadiness on feet (R26.81);Muscle weakness (generalized) (M62.81)     Time: 2248-2500 PT Time Calculation (min) (ACUTE ONLY): 27 min  Charges:  $Gait Training: 8-22 mins $Therapeutic Exercise: 8-22 mins                     Verner Mould, DPT Acute Rehabilitation Services  Office [phone] Pager [phone]  09/16/2020 5:41 PM

## 2020-09-16 NOTE — Progress Notes (Signed)
PROGRESS NOTE    Sophia Mendez  QQV:[id number] DOB: [birth year] DOA: 09/10/2020 PCP: Pcp, No    Brief Narrative:  44 year old female with history of chronic back and neck pain, morbid obesity admitted with shortness of breath and cough.  Symptoms started about 1 week, worse for 2 to 3 days.  In the emergency room COVID-19 positive on 9/7.  Saturations 50%.  Started on high flow nasal cannula and nonrebreather. Patient probably has underlying untreated sleep apnea and obesity hypoventilation syndrome.  She also has fibroid and bleeding from fibroid and has been looking forward for surgery. Patient is not vaccinated against COVID-19. Remains on high flow oxygen with poor recovery because of underlying severe comorbid conditions.   Assessment & Plan:   Active Problems:   Acute hypoxemic respiratory failure due to COVID-19 [facility])   Morbid obesity with BMI of 50.0-59.9, adult (HCC)  Acute hypoxemic respiratory failure due to COVID-19 viral infection:  Bilateral pneumonia due to COVID-19: Continue to monitor due to significant symptoms  chest physiotherapy, incentive spirometry, deep breathing exercises, sputum induction, mucolytic's and bronchodilators. Supplemental oxygen to keep saturations more than 85 %. Covid directed therapy with , steroids, Solu-Medrol, will keep patient on high-dose Solu-Medrol because of very high oxygen requirement. remdesivir, completed 5 days of therapy. Baricitinib, day 6/14.   Due to severity of symptoms, patient will need daily inflammatory markers, chest x-rays, liver function test to monitor and direct COVID-19 therapies. ABG shows PCO2 less than 60, probably chronic. Patient has underlying untreated sleep apnea, obesity hypoventilation and that will make her recovery difficult. CTA of the chest was negative for pulmonary embolism.  Shows patchy bilateral hazy airspace process. Lower extremity duplexes being done today.  COVID-19 Labs  Recent Labs     09/14/20 0254 09/15/20 0331  DDIMER 2.42* 3.62*  FERRITIN 30 34  CRP 1.3* 0.9    Lab Results  Component Value Date   SARSCOV2NAA POSITIVE (A) 09/10/2020    SpO2: 90 % O2 Flow Rate (L/min): 13 L/min  Morbid obesity: BMI more than 50.  Patient will definitely benefit with weight loss surgery.  She has outpatient follow-up. Has underlying untreated sleep apnea and obesity hypoventilation syndrome.   Hypercarbia is compensated, will need close monitoring.  Menorrhagia with fibroid uterus: Significant problem.  Hemoglobin is stable.  Once patient has improvement from her current respiratory status, will refer her to her gynecologist.  She has significant menorrhagia.  Sinus bradycardia: Patient had sinus bradycardia overnight when she was sleeping.  Probably due to underlying untreated sleep apnea.  Not on any rate control medications.  Will monitor.  DVT prophylaxis: Lovenox.   Code Status: Full code Family Communication: Patient's husband on the phone. Disposition Plan: Status is: Inpatient  Remains inpatient appropriate because:Inpatient level of care appropriate due to severity of illness   Dispo: The patient is from: Home              Anticipated d/c is to: Home              Anticipated d/c date is: > 3 days, still on significant oxygen.              Patient currently is not medically stable to d/c.         Consultants:   None  Procedures:   None  Antimicrobials:  Antibiotics Given (last 72 hours)    Date/Time Action Medication Dose Rate   09/14/20 0936 New Bag/Given   remdesivir 100 mg in sodium chloride  0.9 % 100 mL IVPB 100 mg 200 mL/hr         Subjective: Patient seen and examined.  Today she is more interactive and talking.  She was able to get to chair for breakfast and back to the bed with minimal discomfort on 13 L oxygen. Objective: Vitals:   09/16/20 0400 09/16/20 0700 09/16/20 0800 09/16/20 1000  BP: 138/62 (!) 148/71 (!) 160/130 137/81    Pulse: 79 70 78 66  Resp: (!) 22 (!) 23 (!) 25 20  Temp:   97.8 F (36.6 C)   TempSrc:   Oral   SpO2: 95% 93% 92% 90%  Weight:      Height:        Intake/Output Summary (Last 24 hours) at 09/16/2020 1030 Last data filed at 09/16/2020 1000 Gross per 24 hour  Intake --  Output 500 ml  Net -500 ml   Filed Weights   09/10/20 1538  Weight: (!) 154.5 kg    Examination:  General exam: Chronically sick looking, morbidly obese lady on 13 l of oxygen.  Looks comfortable today. Respiratory system: Poor bilateral air entry.  No added sounds.  Difficult to auscultate due to obesity. Cardiovascular system: S1 & S2 heard, RRR. No JVD, murmurs, rubs, gallops or clicks. No pedal edema. Gastrointestinal system: Distended but not tender.  Obese and pendulous. Central nervous system: Alert and oriented. No focal neurological deficits. Extremities: Symmetric 5 x 5 power. Skin: No rashes, lesions or ulcers Psychiatry: Judgement and insight appear normal. Mood & affect flat and anxious.    Data Reviewed: I have personally reviewed following labs and imaging studies  CBC: Recent Labs  Lab 09/11/20 0451 09/12/20 0235 09/13/20 0250 09/14/20 0254 09/15/20 0331  WBC 12.1* 9.8 7.7 9.5 9.4  NEUTROABS 9.8* 7.4 5.8 6.8 5.6  HGB 11.7* 11.6* 11.9* 12.3 11.5*  HCT 39.6 39.3 41.1 41.3 37.8  MCV 99.5 98.5 100.5* 97.4 95.9  PLT 352 363 332 379 449   Basic Metabolic Panel: Recent Labs  Lab 09/11/20 0432 09/12/20 0235 09/13/20 0250 09/14/20 0254 09/15/20 0331  NA 137 136 141 144 147*  K 5.0 4.5 5.1 4.3 3.9  CL 94* 96* 97* 100 97*  CO2 31 32 35* 34* 36*  GLUCOSE 161* 135* 115* 120* 86  BUN 24* 32* 26* 25* 18  CREATININE 1.00 1.01* 0.95 0.79 0.74  CALCIUM 8.0* 8.1* 8.5* 8.7* 9.1  MG 2.4 2.5* 2.7* 2.4 1.9  PHOS 4.9* 3.3 3.1 2.8 3.3   GFR: Estimated Creatinine Clearance: 134 mL/min (by C-G formula based on SCr of 0.74 mg/dL). Liver Function Tests: Recent Labs  Lab 09/11/20 0432  09/12/20 0235 09/13/20 0250 09/14/20 0254 09/15/20 0331  AST 66* 46* 52* 49* 38  ALT 30 29 41 48* 48*  ALKPHOS 48 47 49 55 58  BILITOT 0.5 0.5 0.5 0.5 0.6  PROT 7.3 7.1 6.9 6.9 7.2  ALBUMIN 3.0* 2.8* 2.8* 3.0* 3.2*   No results for input(s): LIPASE, AMYLASE in the last 168 hours. No results for input(s): AMMONIA in the last 168 hours. Coagulation Profile: No results for input(s): INR, PROTIME in the last 168 hours. Cardiac Enzymes: No results for input(s): CKTOTAL, CKMB, CKMBINDEX, TROPONINI in the last 168 hours. BNP (last 3 results) No results for input(s): PROBNP in the last 8760 hours. HbA1C: No results for input(s): HGBA1C in the last 72 hours. CBG: Recent Labs  Lab 09/13/20 2129 09/14/20 0749 09/14/20 1122 09/14/20 1555 09/16/20 0803  GLUCAP 123* 99  134* 123* 139*   Lipid Profile: No results for input(s): CHOL, HDL, LDLCALC, TRIG, CHOLHDL, LDLDIRECT in the last 72 hours. Thyroid Function Tests: No results for input(s): TSH, T4TOTAL, FREET4, T3FREE, THYROIDAB in the last 72 hours. Anemia Panel: Recent Labs    09/14/20 0254 09/15/20 0331  FERRITIN 30 34   Sepsis Labs: Recent Labs  Lab 09/10/20 1209  PROCALCITON <0.10  LATICACIDVEN 1.8    Recent Results (from the past 240 hour(s))  Blood Culture (routine x 2)     Status: None   Collection Time: 09/10/20 12:09 PM   Specimen: BLOOD  Result Value Ref Range Status   Specimen Description   Final    BLOOD LEFT ANTECUBITAL Performed at [facility] [address], [place], [place] [address]    Special Requests   Final    BOTTLES DRAWN AEROBIC AND ANAEROBIC Blood Culture adequate volume Performed at [facility] [address], [place], [place] [address]    Culture   Final    NO GROWTH 5 DAYS Performed at [facility], [place] [address], [place], [place] [address]    Report Status 09/15/2020 FINAL  Final  Blood Culture (routine x 2)     Status: Abnormal    Collection Time: 09/10/20 12:25 PM   Specimen: BLOOD  Result Value Ref Range Status   Specimen Description   Final    BLOOD RIGHT ANTECUBITAL Performed at [facility] [address], [place], [place] [address]    Special Requests   Final    BOTTLES DRAWN AEROBIC AND ANAEROBIC Blood Culture adequate volume Performed at [facility] [address], [place], [place] [address]    Culture  Setup Time   Final    GRAM POSITIVE COCCI IN CLUSTERS AEROBIC BOTTLE ONLY CRITICAL RESULT CALLED TO, READ BACK BY AND VERIFIED WITH: PHARMD MICHELLE L. [phone] FCP    Culture (A)  Final    STAPHYLOCOCCUS HOMINIS THE SIGNIFICANCE OF ISOLATING THIS ORGANISM FROM A SINGLE SET OF BLOOD CULTURES WHEN MULTIPLE SETS ARE DRAWN IS UNCERTAIN. PLEASE NOTIFY THE MICROBIOLOGY DEPARTMENT WITHIN ONE WEEK IF SPECIATION AND SENSITIVITIES ARE REQUIRED. Performed at [facility], [place] [address], [place], [place] [address]    Report Status 09/12/2020 FINAL  Final  Blood Culture ID Panel (Reflexed)     Status: Abnormal   Collection Time: 09/10/20 12:25 PM  Result Value Ref Range Status   Enterococcus faecalis NOT DETECTED NOT DETECTED Final   Enterococcus Faecium NOT DETECTED NOT DETECTED Final   Listeria monocytogenes NOT DETECTED NOT DETECTED Final   Staphylococcus species DETECTED (A) NOT DETECTED Final    Comment: CRITICAL RESULT CALLED TO, READ BACK BY AND VERIFIED WITH: PHARMD MICHELLE L. [phone] FCP    Staphylococcus aureus (BCID) NOT DETECTED NOT DETECTED Final   Staphylococcus epidermidis NOT DETECTED NOT DETECTED Final   Staphylococcus lugdunensis NOT DETECTED NOT DETECTED Final   Streptococcus species NOT DETECTED NOT DETECTED Final   Streptococcus agalactiae NOT DETECTED NOT DETECTED Final   Streptococcus pneumoniae NOT DETECTED NOT DETECTED Final   Streptococcus pyogenes NOT DETECTED NOT DETECTED Final   A.calcoaceticus-baumannii NOT DETECTED NOT  DETECTED Final   Bacteroides fragilis NOT DETECTED NOT DETECTED Final   Enterobacterales NOT DETECTED NOT DETECTED Final   Enterobacter cloacae complex NOT DETECTED NOT DETECTED Final   Escherichia coli NOT DETECTED NOT DETECTED Final   Klebsiella aerogenes NOT DETECTED NOT DETECTED Final  Klebsiella oxytoca NOT DETECTED NOT DETECTED Final   Klebsiella pneumoniae NOT DETECTED NOT DETECTED Final   Proteus species NOT DETECTED NOT DETECTED Final   Salmonella species NOT DETECTED NOT DETECTED Final   Serratia marcescens NOT DETECTED NOT DETECTED Final   Haemophilus influenzae NOT DETECTED NOT DETECTED Final   Neisseria meningitidis NOT DETECTED NOT DETECTED Final   Pseudomonas aeruginosa NOT DETECTED NOT DETECTED Final   Stenotrophomonas maltophilia NOT DETECTED NOT DETECTED Final   Candida albicans NOT DETECTED NOT DETECTED Final   Candida auris NOT DETECTED NOT DETECTED Final   Candida glabrata NOT DETECTED NOT DETECTED Final   Candida krusei NOT DETECTED NOT DETECTED Final   Candida parapsilosis NOT DETECTED NOT DETECTED Final   Candida tropicalis NOT DETECTED NOT DETECTED Final   Cryptococcus neoformans/gattii NOT DETECTED NOT DETECTED Final    Comment: Performed at [facility], [place] [address] [place], [place] [address]  SARS Coronavirus 2 by RT PCR (hospital order, performed in [facility] hospital lab) Nasopharyngeal Nasopharyngeal Swab     Status: Abnormal   Collection Time: 09/10/20  2:40 PM   Specimen: Nasopharyngeal Swab  Result Value Ref Range Status   SARS Coronavirus 2 POSITIVE (A) NEGATIVE Final    Comment: RESULT CALLED TO, READ BACK BY AND VERIFIED WITH: WEST,S. RN @1638  ON 09.14.2021 BY COHEN,K (NOTE) SARS-CoV-2 target nucleic acids are DETECTED  SARS-CoV-2 RNA is generally detectable in upper respiratory specimens  during the acute phase of infection.  Positive results are indicative  of the presence of the identified virus, but do not rule out bacterial  infection or co-infection with other pathogens not detected by the test.  Clinical correlation with patient history and  other diagnostic information is necessary to determine patient infection status.  The expected result is negative.  Fact Sheet for Patients:   StrictlyIdeas.no   Fact Sheet for Healthcare Providers:   BankingDealers.co.za    This test is not yet approved or cleared by the [place] FDA and  has been authorized for detection and/or diagnosis of SARS-CoV-2 by FDA under an Emergency Use Authorization (EUA).  This EUA will remain in effect (meaning  this test can be used) for the duration of  the COVID-19 declaration under Section 564(b)(1) of the Act, 21 U.S.C. section 360-bbb-3(b)(1), unless the authorization is terminated or revoked sooner.  Performed at [facility], [place] [address], [place], [place] [address]   MRSA PCR Screening     Status: None   Collection Time: 09/12/20  3:48 PM   Specimen: Nasal Mucosa; Nasopharyngeal  Result Value Ref Range Status   MRSA by PCR NEGATIVE NEGATIVE Final    Comment:        The GeneXpert MRSA Assay (FDA approved for NASAL specimens only), is one component of a comprehensive MRSA colonization surveillance program. It is not intended to diagnose MRSA infection nor to guide or monitor treatment for MRSA infections. Performed at [facility], [place] [address] [place], [place] [address]          Radiology Studies: CT ANGIO CHEST PE W OR WO CONTRAST  Result Date: 09/15/2020 CLINICAL DATA:  COVID-19 pneumonia with hypoxia and elevated D-dimer. Possible pulmonary embolism. EXAM: CT ANGIOGRAPHY CHEST WITH CONTRAST TECHNIQUE: Multidetector CT imaging of the chest was performed using the standard protocol during bolus administration of intravenous contrast. Multiplanar CT image reconstructions and MIPs were obtained to evaluate the vascular  anatomy. CONTRAST:  139mL OMNIPAQUE IOHEXOL 350 MG/ML SOLN  COMPARISON:  None. FINDINGS: Several factors limiting optimal imaging including patient on 15 L high-flow oxygen as well as positional IV as had to image patient with arm positioned by side. Cardiovascular: Mild cardiomegaly. Thoracic aorta is normal in caliber. Pulmonary arterial system demonstrates no evidence of pulmonary emboli. Mediastinum/Nodes: No mediastinal or hilar adenopathy. Remaining mediastinal structures are normal. Lungs/Pleura: Lungs are adequately inflated demonstrate patchy bilateral hazy airspace process compatible with known COVID-19 pneumonia. No effusion. Airways are unremarkable. Upper Abdomen: No acute findings. Musculoskeletal: No focal abnormality. Review of the MIP images confirms the above findings. IMPRESSION: 1. No evidence of pulmonary embolism. 2. Patchy bilateral hazy airspace process compatible with known COVID-19 pneumonia. 3. Mild cardiomegaly. Electronically Signed   By: Marin Olp M.D.   On: 09/15/2020 16:35        Scheduled Meds: . vitamin C  500 mg Oral Daily  . baricitinib  4 mg Oral Daily  . Chlorhexidine Gluconate Cloth  6 each Topical Daily  . enoxaparin (LOVENOX) injection  80 mg Subcutaneous Q24H  . insulin aspart  0-20 Units Subcutaneous TID WC  . insulin aspart  0-5 Units Subcutaneous QHS  . methylPREDNISolone (SOLU-MEDROL) injection  60 mg Intravenous Q12H  . sodium chloride flush  3 mL Intravenous Q12H  . sodium chloride flush  3 mL Intravenous Q12H  . zinc sulfate  220 mg Oral Daily   Continuous Infusions: . sodium chloride       LOS: 6 days    Time spent: 30 minutes    Barb Merino, MD [facility] Pager [phone]

## 2020-09-17 LAB — PHOSPHORUS: Phosphorus: 4.5 mg/dL (ref 2.5–4.6)

## 2020-09-17 LAB — MAGNESIUM: Magnesium: 2.2 mg/dL (ref 1.7–2.4)

## 2020-09-17 LAB — COMPREHENSIVE METABOLIC PANEL
ALT: 37 U/L (ref 0–44)
AST: 31 U/L (ref 15–41)
Albumin: 3 g/dL — ABNORMAL LOW (ref 3.5–5.0)
Alkaline Phosphatase: 41 U/L (ref 38–126)
Anion gap: 10 (ref 5–15)
BUN: 22 mg/dL — ABNORMAL HIGH (ref 6–20)
CO2: 36 mmol/L — ABNORMAL HIGH (ref 22–32)
Calcium: 8.4 mg/dL — ABNORMAL LOW (ref 8.9–10.3)
Chloride: 97 mmol/L — ABNORMAL LOW (ref 98–111)
Creatinine, Ser: 0.77 mg/dL (ref 0.44–1.00)
GFR calc Af Amer: >60 mL/min (ref >60)
GFR calc non Af Amer: >60 mL/min (ref >60)
Glucose, Bld: 94 mg/dL (ref 70–99)
Potassium: 3.7 mmol/L (ref 3.5–5.1)
Sodium: 143 mmol/L (ref 135–145)
Total Bilirubin: 0.7 mg/dL (ref 0.3–1.2)
Total Protein: 6.2 g/dL — ABNORMAL LOW (ref 6.5–8.1)

## 2020-09-17 LAB — GLUCOSE, CAPILLARY
Glucose-Capillary: 82 mg/dL (ref 70–99)
Glucose-Capillary: 105 mg/dL — ABNORMAL HIGH (ref 70–99)
Glucose-Capillary: 176 mg/dL — ABNORMAL HIGH (ref 70–99)
Glucose-Capillary: 194 mg/dL — ABNORMAL HIGH (ref 70–99)

## 2020-09-17 LAB — D-DIMER, QUANTITATIVE: D-Dimer, Quant: 1.88 ug/mL-FEU — ABNORMAL HIGH (ref 0.00–0.50)

## 2020-09-17 NOTE — Progress Notes (Signed)
PROGRESS NOTE    Sophia Mendez  BZJ:[id number] DOB: [birth year] DOA: 09/10/2020 PCP: Pcp, No    Brief Narrative:  44 year old female with history of chronic back and neck pain, morbid obesity admitted with shortness of breath and cough.  Symptoms started about 1 week, worse for 2 to 3 days.  In the emergency room COVID-19 positive on 9/7.  Saturations 50%.  Started on high flow nasal cannula and nonrebreather. Patient probably has underlying untreated sleep apnea and obesity hypoventilation syndrome.  She also has fibroid and bleeding from fibroid and has been looking forward for surgery. Patient is not vaccinated against COVID-19. Remains on very high flow oxygen now with some improvement on oxygenation.   Assessment & Plan:   Active Problems:   Acute hypoxemic respiratory failure due to COVID-19 [facility])   Morbid obesity with BMI of 50.0-59.9, adult (HCC)  Acute hypoxemic respiratory failure due to COVID-19 viral infection:  Bilateral pneumonia due to COVID-19: Continue to monitor due to significant symptoms, still on 5 to 6 L of oxygen. chest physiotherapy, incentive spirometry, deep breathing exercises, sputum induction, mucolytic's and bronchodilators. Supplemental oxygen to keep saturations more than 85 %. Covid directed therapy with , steroids, Solu-Medrol, will keep patient on high-dose Solu-Medrol because of very high oxygen requirement. remdesivir, completed 5 days of therapy. Baricitinib, day 7/14.   Due to severity of symptoms, patient will need daily inflammatory markers, chest x-rays, liver function test to monitor and direct COVID-19 therapies. ABG shows PCO2 less than 60, probably chronic. Patient has underlying untreated sleep apnea, obesity hypoventilation and that will make her recovery difficult. CTA of the chest was negative for pulmonary embolism.  Shows patchy bilateral hazy airspace process. Lower extremity duplexes negative for DVT.  COVID-19 Labs  Recent Labs     09/15/20 0331 09/17/20 0243  DDIMER 3.62* 1.88*  FERRITIN 34  --   CRP 0.9  --     Lab Results  Component Value Date   SARSCOV2NAA POSITIVE (A) 09/10/2020    SpO2: 95 % O2 Flow Rate (L/min): 7 L/min  Morbid obesity: BMI more than 50.  Patient will definitely benefit with weight loss surgery.  She has outpatient follow-up. Has underlying untreated sleep apnea and obesity hypoventilation syndrome.   Hypercarbia is compensated, will need close monitoring.  Menorrhagia with fibroid uterus: Significant problem.  Hemoglobin is stable.  Once patient has improvement from her current respiratory status, will refer her to her gynecologist.  She has significant menorrhagia.  Sinus bradycardia: Patient had sinus bradycardia overnight when she was sleeping.  Probably due to underlying untreated sleep apnea.  Not on any rate control medications.  Will monitor. Improved.  DVT prophylaxis: Lovenox.   Code Status: Full code Family Communication: Patient's husband on the FaceTime with the patient. Disposition Plan: Status is: Inpatient  Remains inpatient appropriate because:Inpatient level of care appropriate due to severity of illness   Dispo: The patient is from: Home              Anticipated d/c is to: Home              Anticipated d/c date is: > 3 days, still on significant oxygen.              Patient currently is not medically stable to d/c.  Continue to mobilize.  Patient is clinically improving but is still on high flow oxygen.  Probable discharge in 2 to 3 days if can maintain on less than 6 L of oxygen  on mobility.    Consultants:   None  Procedures:   None  Antimicrobials:  Antibiotics Given (last 72 hours)    None         Subjective: Patient seen and examined.  No overnight events.  Mobility was better.  She was able to take more deep breaths in.  Today she was more interactive.  On 7 L of oxygen.  Telemetry shows sinus bradycardia, heart rate mostly more  than 50. Objective: Vitals:   09/17/20 0600 09/17/20 0632 09/17/20 0700 09/17/20 0800  BP: 128/64  (!) 141/73 (!) 125/42  Pulse: (!) 55 (!) 56 62 86  Resp: (!) 21 18 14  (!) 21  Temp:    97.8 F (36.6 C)  TempSrc:    Oral  SpO2: 93% 100% 100% 95%  Weight:      Height:        Intake/Output Summary (Last 24 hours) at 09/17/2020 1121 Last data filed at 09/16/2020 1800 Gross per 24 hour  Intake 720 ml  Output --  Net 720 ml   Filed Weights   09/10/20 1538  Weight: (!) 154.5 kg    Examination:  General exam: Chronically sick looking morbidly obese lady not in any distress today.  On 6 L of oxygen at rest. Respiratory system: Poor bilateral air entry.  No added sounds.  Difficult to auscultate due to obesity. Cardiovascular system: S1 & S2 heard, RRR. No JVD, murmurs, rubs, gallops or clicks. No pedal edema. Gastrointestinal system: Distended but not tender.  Obese and pendulous. Central nervous system: Alert and oriented. No focal neurological deficits. Extremities: Symmetric 5 x 5 power. Skin: No rashes, lesions or ulcers Psychiatry: Judgement and insight appear normal. Mood & affect normal.    Data Reviewed: I have personally reviewed following labs and imaging studies  CBC: Recent Labs  Lab 09/11/20 0451 09/12/20 0235 09/13/20 0250 09/14/20 0254 09/15/20 0331  WBC 12.1* 9.8 7.7 9.5 9.4  NEUTROABS 9.8* 7.4 5.8 6.8 5.6  HGB 11.7* 11.6* 11.9* 12.3 11.5*  HCT 39.6 39.3 41.1 41.3 37.8  MCV 99.5 98.5 100.5* 97.4 95.9  PLT 352 363 332 379 237   Basic Metabolic Panel: Recent Labs  Lab 09/12/20 0235 09/13/20 0250 09/14/20 0254 09/15/20 0331 09/17/20 0243  NA 136 141 144 147* 143  K 4.5 5.1 4.3 3.9 3.7  CL 96* 97* 100 97* 97*  CO2 32 35* 34* 36* 36*  GLUCOSE 135* 115* 120* 86 94  BUN 32* 26* 25* 18 22*  CREATININE 1.01* 0.95 0.79 0.74 0.77  CALCIUM 8.1* 8.5* 8.7* 9.1 8.4*  MG 2.5* 2.7* 2.4 1.9 2.2  PHOS 3.3 3.1 2.8 3.3 4.5   GFR: Estimated Creatinine  Clearance: 134 mL/min (by C-G formula based on SCr of 0.77 mg/dL). Liver Function Tests: Recent Labs  Lab 09/12/20 0235 09/13/20 0250 09/14/20 0254 09/15/20 0331 09/17/20 0243  AST 46* 52* 49* 38 31  ALT 29 41 48* 48* 37  ALKPHOS 47 49 55 58 41  BILITOT 0.5 0.5 0.5 0.6 0.7  PROT 7.1 6.9 6.9 7.2 6.2*  ALBUMIN 2.8* 2.8* 3.0* 3.2* 3.0*   No results for input(s): LIPASE, AMYLASE in the last 168 hours. No results for input(s): AMMONIA in the last 168 hours. Coagulation Profile: No results for input(s): INR, PROTIME in the last 168 hours. Cardiac Enzymes: No results for input(s): CKTOTAL, CKMB, CKMBINDEX, TROPONINI in the last 168 hours. BNP (last 3 results) No results for input(s): PROBNP in the last 8760 hours.  HbA1C: No results for input(s): HGBA1C in the last 72 hours. CBG: Recent Labs  Lab 09/16/20 0803 09/16/20 1149 09/16/20 1614 09/16/20 2220 09/17/20 0755  GLUCAP 139* 163* 83 81 82   Lipid Profile: No results for input(s): CHOL, HDL, LDLCALC, TRIG, CHOLHDL, LDLDIRECT in the last 72 hours. Thyroid Function Tests: No results for input(s): TSH, T4TOTAL, FREET4, T3FREE, THYROIDAB in the last 72 hours. Anemia Panel: Recent Labs    09/15/20 0331  FERRITIN 34   Sepsis Labs: Recent Labs  Lab 09/10/20 1209  PROCALCITON <0.10  LATICACIDVEN 1.8    Recent Results (from the past 240 hour(s))  Blood Culture (routine x 2)     Status: None   Collection Time: 09/10/20 12:09 PM   Specimen: BLOOD  Result Value Ref Range Status   Specimen Description   Final    BLOOD LEFT ANTECUBITAL Performed at [facility] [address] [place], [place] [address]    Special Requests   Final    BOTTLES DRAWN AEROBIC AND ANAEROBIC Blood Culture adequate volume Performed at [facility] [address], [place], [place] [address]    Culture   Final    NO GROWTH 5 DAYS Performed at [facility], [place] [address], [place], [place]  [address]    Report Status 09/15/2020 FINAL  Final  Blood Culture (routine x 2)     Status: Abnormal   Collection Time: 09/10/20 12:25 PM   Specimen: BLOOD  Result Value Ref Range Status   Specimen Description   Final    BLOOD RIGHT ANTECUBITAL Performed at [facility] [address] [place], [place] [address]    Special Requests   Final    BOTTLES DRAWN AEROBIC AND ANAEROBIC Blood Culture adequate volume Performed at [facility] [address], [place], [place] [address]    Culture  Setup Time   Final    GRAM POSITIVE COCCI IN CLUSTERS AEROBIC BOTTLE ONLY CRITICAL RESULT CALLED TO, READ BACK BY AND VERIFIED WITH: PHARMD MICHELLE L. [phone] [id number] FCP    Culture (A)  Final    STAPHYLOCOCCUS HOMINIS THE SIGNIFICANCE OF ISOLATING THIS ORGANISM FROM A SINGLE SET OF BLOOD CULTURES WHEN MULTIPLE SETS ARE DRAWN IS UNCERTAIN. PLEASE NOTIFY THE MICROBIOLOGY DEPARTMENT WITHIN ONE WEEK IF SPECIATION AND SENSITIVITIES ARE REQUIRED. Performed at [facility], [place] [address], [place], [place] [address]    Report Status 09/12/2020 FINAL  Final  Blood Culture ID Panel (Reflexed)     Status: Abnormal   Collection Time: 09/10/20 12:25 PM  Result Value Ref Range Status   Enterococcus faecalis NOT DETECTED NOT DETECTED Final   Enterococcus Faecium NOT DETECTED NOT DETECTED Final   Listeria monocytogenes NOT DETECTED NOT DETECTED Final   Staphylococcus species DETECTED (A) NOT DETECTED Final    Comment: CRITICAL RESULT CALLED TO, READ BACK BY AND VERIFIED WITH: PHARMD MICHELLE L. [phone] [id number] FCP    Staphylococcus aureus (BCID) NOT DETECTED NOT DETECTED Final   Staphylococcus epidermidis NOT DETECTED NOT DETECTED Final   Staphylococcus lugdunensis NOT DETECTED NOT DETECTED Final   Streptococcus species NOT DETECTED NOT DETECTED Final   Streptococcus agalactiae NOT DETECTED NOT DETECTED Final   Streptococcus pneumoniae NOT DETECTED NOT DETECTED Final    Streptococcus pyogenes NOT DETECTED NOT DETECTED Final   A.calcoaceticus-baumannii NOT DETECTED NOT DETECTED Final   Bacteroides fragilis NOT DETECTED NOT DETECTED Final   Enterobacterales NOT DETECTED NOT DETECTED Final   Enterobacter  cloacae complex NOT DETECTED NOT DETECTED Final   Escherichia coli NOT DETECTED NOT DETECTED Final   Klebsiella aerogenes NOT DETECTED NOT DETECTED Final   Klebsiella oxytoca NOT DETECTED NOT DETECTED Final   Klebsiella pneumoniae NOT DETECTED NOT DETECTED Final   Proteus species NOT DETECTED NOT DETECTED Final   Salmonella species NOT DETECTED NOT DETECTED Final   Serratia marcescens NOT DETECTED NOT DETECTED Final   Haemophilus influenzae NOT DETECTED NOT DETECTED Final   Neisseria meningitidis NOT DETECTED NOT DETECTED Final   Pseudomonas aeruginosa NOT DETECTED NOT DETECTED Final   Stenotrophomonas maltophilia NOT DETECTED NOT DETECTED Final   Candida albicans NOT DETECTED NOT DETECTED Final   Candida auris NOT DETECTED NOT DETECTED Final   Candida glabrata NOT DETECTED NOT DETECTED Final   Candida krusei NOT DETECTED NOT DETECTED Final   Candida parapsilosis NOT DETECTED NOT DETECTED Final   Candida tropicalis NOT DETECTED NOT DETECTED Final   Cryptococcus neoformans/gattii NOT DETECTED NOT DETECTED Final    Comment: Performed at [facility], [place] [address], [place], [place] [address]  SARS Coronavirus 2 by RT PCR (hospital order, performed in [facility] hospital lab) Nasopharyngeal Nasopharyngeal Swab     Status: Abnormal   Collection Time: 09/10/20  2:40 PM   Specimen: Nasopharyngeal Swab  Result Value Ref Range Status   SARS Coronavirus 2 POSITIVE (A) NEGATIVE Final    Comment: RESULT CALLED TO, READ BACK BY AND VERIFIED WITH: WEST,S. RN @1638  ON 09.14.2021 BY COHEN,K (NOTE) SARS-CoV-2 target nucleic acids are DETECTED  SARS-CoV-2 RNA is generally detectable in upper respiratory specimens  during the acute phase of infection.   Positive results are indicative  of the presence of the identified virus, but do not rule out bacterial infection or co-infection with other pathogens not detected by the test.  Clinical correlation with patient history and  other diagnostic information is necessary to determine patient infection status.  The expected result is negative.  Fact Sheet for Patients:   StrictlyIdeas.no   Fact Sheet for Healthcare Providers:   BankingDealers.co.za    This test is not yet approved or cleared by the [place] FDA and  has been authorized for detection and/or diagnosis of SARS-CoV-2 by FDA under an Emergency Use Authorization (EUA).  This EUA will remain in effect (meaning  this test can be used) for the duration of  the COVID-19 declaration under Section 564(b)(1) of the Act, 21 U.S.C. section 360-bbb-3(b)(1), unless the authorization is terminated or revoked sooner.  Performed at [facility], [place] [address], [place], [place] [address]   MRSA PCR Screening     Status: None   Collection Time: 09/12/20  3:48 PM   Specimen: Nasal Mucosa; Nasopharyngeal  Result Value Ref Range Status   MRSA by PCR NEGATIVE NEGATIVE Final    Comment:        The GeneXpert MRSA Assay (FDA approved for NASAL specimens only), is one component of a comprehensive MRSA colonization surveillance program. It is not intended to diagnose MRSA infection nor to guide or monitor treatment for MRSA infections. Performed at [facility], [place] [address] [place], [place] [address]          Radiology Studies: CT ANGIO CHEST PE W OR WO CONTRAST  Result Date: 09/15/2020 CLINICAL DATA:  COVID-19 pneumonia with hypoxia and elevated D-dimer. Possible pulmonary embolism. EXAM: CT ANGIOGRAPHY CHEST WITH CONTRAST TECHNIQUE: Multidetector CT imaging of the chest was performed using the standard protocol during  bolus administration of  intravenous contrast. Multiplanar CT image reconstructions and MIPs were obtained to evaluate the vascular anatomy. CONTRAST:  196mL OMNIPAQUE IOHEXOL 350 MG/ML SOLN COMPARISON:  None. FINDINGS: Several factors limiting optimal imaging including patient on 15 L high-flow oxygen as well as positional IV as had to image patient with arm positioned by side. Cardiovascular: Mild cardiomegaly. Thoracic aorta is normal in caliber. Pulmonary arterial system demonstrates no evidence of pulmonary emboli. Mediastinum/Nodes: No mediastinal or hilar adenopathy. Remaining mediastinal structures are normal. Lungs/Pleura: Lungs are adequately inflated demonstrate patchy bilateral hazy airspace process compatible with known COVID-19 pneumonia. No effusion. Airways are unremarkable. Upper Abdomen: No acute findings. Musculoskeletal: No focal abnormality. Review of the MIP images confirms the above findings. IMPRESSION: 1. No evidence of pulmonary embolism. 2. Patchy bilateral hazy airspace process compatible with known COVID-19 pneumonia. 3. Mild cardiomegaly. Electronically Signed   By: Marin Olp M.D.   On: 09/15/2020 16:35   VAS [place] LOWER EXTREMITY VENOUS (DVT)  Result Date: 09/16/2020  Lower Venous DVTStudy Indications: Swelling.  Limitations: Body habitus and poor ultrasound/tissue interface. Comparison Study: No prior studies. Performing Technologist: Darlin Coco  Examination Guidelines: A complete evaluation includes B-mode imaging, spectral Doppler, color Doppler, and power Doppler as needed of all accessible portions of each vessel. Bilateral testing is considered an integral part of a complete examination. Limited examinations for reoccurring indications may be performed as noted. The reflux portion of the exam is performed with the patient in reverse Trendelenburg.  +---------+---------------+---------+-----------+----------+--------------+ RIGHT    CompressibilityPhasicitySpontaneityPropertiesThrombus Aging  +---------+---------------+---------+-----------+----------+--------------+ CFV      Full           Yes      Yes                                 +---------+---------------+---------+-----------+----------+--------------+ SFJ      Full                                                        +---------+---------------+---------+-----------+----------+--------------+ FV Prox  Full                                                        +---------+---------------+---------+-----------+----------+--------------+ FV Mid   Full                                                        +---------+---------------+---------+-----------+----------+--------------+ FV DistalFull                                                        +---------+---------------+---------+-----------+----------+--------------+ PFV      Full                                                        +---------+---------------+---------+-----------+----------+--------------+  POP      Full           Yes      Yes                                 +---------+---------------+---------+-----------+----------+--------------+ PTV      Full                                                        +---------+---------------+---------+-----------+----------+--------------+ PERO     Full                                                        +---------+---------------+---------+-----------+----------+--------------+   +---------+---------------+---------+-----------+----------+---------------+ LEFT     CompressibilityPhasicitySpontaneityPropertiesThrombus Aging  +---------+---------------+---------+-----------+----------+---------------+ CFV      Full           Yes      No                                   +---------+---------------+---------+-----------+----------+---------------+ SFJ      Full                                                          +---------+---------------+---------+-----------+----------+---------------+ FV Prox  Full                                                         +---------+---------------+---------+-----------+----------+---------------+ FV Mid                  Yes      Yes                  Patent by color +---------+---------------+---------+-----------+----------+---------------+ FV Distal               Yes      Yes                  Patent by color +---------+---------------+---------+-----------+----------+---------------+ PFV      Full                                                         +---------+---------------+---------+-----------+----------+---------------+ POP      Full           Yes      Yes                                  +---------+---------------+---------+-----------+----------+---------------+ PTV      Full                                                         +---------+---------------+---------+-----------+----------+---------------+  PERO     Full                                                         +---------+---------------+---------+-----------+----------+---------------+     Summary: RIGHT: - There is no evidence of deep vein thrombosis in the lower extremity. However, portions of this examination were limited- see technologist comments above.  - No cystic structure found in the popliteal fossa.  LEFT: - There is no evidence of deep vein thrombosis in the lower extremity. However, portions of this examination were limited- see technologist comments above.  - No cystic structure found in the popliteal fossa.  *See table(s) above for measurements and observations. Electronically signed by Ruta Hinds MD on 09/16/2020 at 5:20:48 PM.    Final         Scheduled Meds: . vitamin C  500 mg Oral Daily  . baricitinib  4 mg Oral Daily  . Chlorhexidine Gluconate Cloth  6 each Topical Daily  . enoxaparin (LOVENOX) injection  80 mg Subcutaneous Q24H    . insulin aspart  0-20 Units Subcutaneous TID WC  . insulin aspart  0-5 Units Subcutaneous QHS  . methylPREDNISolone (SOLU-MEDROL) injection  60 mg Intravenous Q12H  . sodium chloride flush  3 mL Intravenous Q12H  . sodium chloride flush  3 mL Intravenous Q12H  . zinc sulfate  220 mg Oral Daily   Continuous Infusions: . sodium chloride       LOS: 7 days    Time spent: 30 minutes    Barb Merino, MD [facility] Pager [phone]

## 2020-09-18 DIAGNOSIS — R739 Hyperglycemia, unspecified: Secondary | ICD-10-CM

## 2020-09-18 DIAGNOSIS — J1282 Pneumonia due to coronavirus disease 2019: Secondary | ICD-10-CM

## 2020-09-18 DIAGNOSIS — T380X5A Adverse effect of glucocorticoids and synthetic analogues, initial encounter: Secondary | ICD-10-CM

## 2020-09-18 DIAGNOSIS — E662 Morbid (severe) obesity with alveolar hypoventilation: Secondary | ICD-10-CM

## 2020-09-18 DIAGNOSIS — U071 COVID-19: Secondary | ICD-10-CM

## 2020-09-18 LAB — COMPREHENSIVE METABOLIC PANEL WITH GFR
ALT: 34 U/L (ref 0–44)
AST: 21 U/L (ref 15–41)
Albumin: 2.9 g/dL — ABNORMAL LOW (ref 3.5–5.0)
Alkaline Phosphatase: 39 U/L (ref 38–126)
Anion gap: 9 (ref 5–15)
BUN: 20 mg/dL (ref 6–20)
CO2: 32 mmol/L (ref 22–32)
Calcium: 8.6 mg/dL — ABNORMAL LOW (ref 8.9–10.3)
Chloride: 97 mmol/L — ABNORMAL LOW (ref 98–111)
Creatinine, Ser: 0.77 mg/dL (ref 0.44–1.00)
GFR calc Af Amer: >60 mL/min
GFR calc non Af Amer: >60 mL/min
Glucose, Bld: 200 mg/dL — ABNORMAL HIGH (ref 70–99)
Potassium: 4.4 mmol/L (ref 3.5–5.1)
Sodium: 138 mmol/L (ref 135–145)
Total Bilirubin: 0.7 mg/dL (ref 0.3–1.2)
Total Protein: 6.2 g/dL — ABNORMAL LOW (ref 6.5–8.1)

## 2020-09-18 LAB — GLUCOSE, CAPILLARY
Glucose-Capillary: 152 mg/dL — ABNORMAL HIGH (ref 70–99)
Glucose-Capillary: 163 mg/dL — ABNORMAL HIGH (ref 70–99)
Glucose-Capillary: 114 mg/dL — ABNORMAL HIGH (ref 70–99)
Glucose-Capillary: 87 mg/dL (ref 70–99)

## 2020-09-18 LAB — D-DIMER, QUANTITATIVE: D-Dimer, Quant: 1.34 ug{FEU}/mL — ABNORMAL HIGH (ref 0.00–0.50)

## 2020-09-18 MED ORDER — IPRATROPIUM-ALBUTEROL 20-100 MCG/ACT IN AERS
1.0000 | INHALATION_SPRAY | Freq: Four times a day (QID) | RESPIRATORY_TRACT | Status: DC
  Administered 2020-09-18 – 2020-09-20 (×9): 1 via RESPIRATORY_TRACT
  Filled 2020-09-18: qty 4

## 2020-09-18 MED ORDER — HYDROCOD POLST-CPM POLST ER 10-8 MG/5ML PO SUER
5.0000 mL | Freq: Two times a day (BID) | ORAL | Status: DC
  Administered 2020-09-18 – 2020-09-20 (×4): 5 mL via ORAL
  Filled 2020-09-18 (×4): qty 5

## 2020-09-18 MED ORDER — METHYLPREDNISOLONE SODIUM SUCC 125 MG IJ SOLR
60.0000 mg | Freq: Every day | INTRAMUSCULAR | Status: DC
  Administered 2020-09-19: 60 mg via INTRAVENOUS
  Filled 2020-09-18: qty 2

## 2020-09-18 NOTE — Progress Notes (Signed)
Occupational Therapy Progress Note  Patient agreeable to functional ambulation in room. Patient tolerate ~60 ft weaned to .25L O2 with mild shortness of breath and desaturation to 86-87%. With seated rest and return to 2L O2 patient recover to 94% within ~30 seconds. Patient reports feeling better today compared to previous session, less fatigue with activity. Will continue to follow.    09/18/20 1500  OT Visit Information  Last OT Received On 09/18/20  Assistance Needed +1  History of Present Illness Patient is 44 y.o. female with PMH significant for chronic back and neck pain, and morbid obesity, sleep apnea, and obesity hypoventilation syndrome. Pt admitted with shortness of breath and cough.  Symptoms started ~ 1 week PTA and worsened for 2 to 3 days.  Pt unvaccinated and COVID-19 positive on 9/7 in [facility] ED; saturations 50%.  Started on high flow nasal cannula and nonrebreather.  Precautions  Precautions Fall  Precaution Comments monitor vitals currently 2L O2  Pain Assessment  Pain Assessment No/denies pain  Cognition  Arousal/Alertness Awake/alert  Behavior During Therapy WFL for tasks assessed/performed  Overall Cognitive Status Within Functional Limits for tasks assessed  ADL  General ADL Comments session focus on patient activity tolerance with functional ambulation necessary for participation in daily routine. Patient ambulate approx 60 ft in room, O2 weaned to .25L with desaturation to 86-87%. Patient returned to 2L with min cues for pursed lip breathing and recover to 94% within approximately 30 seconds. Continue to encourage use of IS, patient verbalize understanding.  Balance  Overall balance assessment Needs assistance  Sitting-balance support Feet supported  Sitting balance-Leahy Scale Good  Standing balance support No upper extremity supported  Standing balance-Leahy Scale Fair  Transfers  Overall transfer level Modified independent  Equipment used None  Transfers Sit  to/from Stand  OT - End of Session  Equipment Utilized During Treatment Oxygen  Activity Tolerance Patient tolerated treatment well  Patient left in chair;with call bell/phone within reach  Nurse Communication Mobility status  OT Assessment/Plan  OT Plan Discharge plan remains appropriate  OT Visit Diagnosis Other abnormalities of gait and mobility (R26.89)  OT Frequency (ACUTE ONLY) Min 2X/week  Follow Up Recommendations No OT follow up  OT Equipment Tub/shower seat  AM-PAC OT "6 Clicks" Daily Activity Outcome Measure (Version 2)  Help from another person eating meals? 4  Help from another person taking care of personal grooming? 4  Help from another person toileting, which includes using toliet, bedpan, or urinal? 3  Help from another person bathing (including washing, rinsing, drying)? 3  Help from another person to put on and taking off regular upper body clothing? 4  Help from another person to put on and taking off regular lower body clothing? 3  6 Click Score 21  OT Goal Progression  Progress towards OT goals Progressing toward goals  Acute Rehab OT Goals  Patient Stated Goal feel better  OT Goal Formulation With patient  Time For Goal Achievement 09/28/20  Potential to Achieve Goals Good  OT Time Calculation  OT Start Time (ACUTE ONLY) 1336  OT Stop Time (ACUTE ONLY) 1351  OT Time Calculation (min) 15 min  OT General Charges  $OT Visit 1 Visit  OT Treatments  $Therapeutic Activity 8-22 mins   Delbert Phenix OT OT pager: [phone]

## 2020-09-18 NOTE — Progress Notes (Signed)
Patient states she was unable to order dinner as her room phone is malfunctioning. RN attempted to dial out and found that room phone is positional and will disconnect in speaking position. Will request new phone in the morning and will place patient's breakfast order in the AM pending replacement of room phone. Provided patient with sandwich, diet shasta ginger ale, sugar free jello and one pack graham crackers with a side of peanut butter.

## 2020-09-18 NOTE — Progress Notes (Signed)
Physical Therapy Treatment Patient Details Name: Sophia Mendez MRN: [id number] DOB: February 05, 1976 Today's Date: 09/18/2020    History of Present Illness Patient is 44 y.o. female with PMH significant for chronic back and neck pain, and morbid obesity, sleep apnea, and obesity hypoventilation syndrome. Pt admitted with shortness of breath and cough.  Symptoms started ~ 1 week PTA and worsened for 2 to 3 days.  Pt unvaccinated and COVID-19 positive on 9/7 in [facility] ED; saturations 50%.      PT Comments     pt is progressing well. On 2L [place] O2 currently. Trial of RA with light activity, pt desats to 83% although difficulty getting an adequate waveform at times d/t loose finger sensor.  SpO2=88-94% on 2L with activity. Recovery  time ~ 2 mins.  HR max 118. Pt reports minimal DOE.  Follow Up Recommendations  Supervision - Intermittent;No PT follow up     Equipment Recommendations  None recommended by PT    Recommendations for Other Services       Precautions / Restrictions Precautions Precautions: Fall Precaution Comments: monitor vitals currently 2L O2 Restrictions Weight Bearing Restrictions: No    Mobility  Bed Mobility               General bed mobility comments: pt OOB in recliner and ended in recliner.  Transfers Overall transfer level: Modified independent Equipment used: None Transfers: Sit to/from Stand Sit to Stand: Modified independent (Device/Increase time)         General transfer comment: repeated sit<>stands for strengthening and activity tolerance   Ambulation/Gait Ambulation/Gait assistance: Supervision Gait Distance (Feet): 60 Feet (x2 in room ) Assistive device: None Gait Pattern/deviations: Step-through pattern;Decreased stride length;Wide base of support     General Gait Details: steady gait, minimal DOE (2/4) however incr coughing with activity   Stairs             Wheelchair Mobility    Modified Rankin (Stroke Patients Only)        Balance Overall balance assessment: Needs assistance Sitting-balance support: Feet supported Sitting balance-Leahy Scale: Good     Standing balance support: No upper extremity supported Standing balance-Leahy Scale: Fair Standing balance comment: fait to good, not tested to mod challenges                            Cognition Arousal/Alertness: Awake/alert Behavior During Therapy: WFL for tasks assessed/performed Overall Cognitive Status: Within Functional Limits for tasks assessed                                        Exercises Other Exercises Other Exercises: 2x5 reps sit<>stand no UE use for power up.     General Comments        Pertinent Vitals/Pain Pain Assessment: No/denies pain    Home Living                      Prior Function            PT Goals (current goals can now be found in the care plan section) Acute Rehab PT Goals Patient Stated Goal: feel better PT Goal Formulation: With patient Time For Goal Achievement: 09/27/20 Potential to Achieve Goals: Good Progress towards PT goals: Progressing toward goals    Frequency    Min 3X/week      PT Plan  Current plan remains appropriate    Co-evaluation              AM-PAC PT "6 Clicks" Mobility   Outcome Measure  Help needed turning from your back to your side while in a flat bed without using bedrails?: None Help needed moving from lying on your back to sitting on the side of a flat bed without using bedrails?: None Help needed moving to and from a bed to a chair (including a wheelchair)?: None Help needed standing up from a chair using your arms (e.g., wheelchair or bedside chair)?: None Help needed to walk in hospital room?: A Little Help needed climbing 3-5 steps with a railing? : A Little 6 Click Score: 22    End of Session Equipment Utilized During Treatment: Oxygen Activity Tolerance: Patient tolerated treatment well Patient left: with call  bell/phone within reach;in chair   PT Visit Diagnosis: Difficulty in walking, not elsewhere classified (R26.2);Unsteadiness on feet (R26.81);Muscle weakness (generalized) (M62.81)     Time: 2909-0301 PT Time Calculation (min) (ACUTE ONLY): 21 min  Charges:  $Gait Training: 8-22 mins                     Baxter Flattery, PT  Acute Rehab Dept ([place]) [phone] Pager [phone]  09/18/2020    [facility], [place] 09/18/2020, 4:32 PM

## 2020-09-18 NOTE — TOC Progression Note (Signed)
Transition of Care [facility]) - Progression Note    Patient Details  Name: Sophia Mendez MRN: [id number] Date of Birth: 06/27/1976  Transition of Care [facility]) CM/SW Contact  Leeroy Cha, RN Phone Number: 09/18/2020, 9:46 AM  Clinical Narrative:    [place] at 4l/min, iv solu medrol Plan to return to home following for progression.   Expected Discharge Plan: [place] Barriers to Discharge: Continued Medical Work up  Expected Discharge Plan and Services Expected Discharge Plan: [place]   Discharge Planning Services: CM Consult   Living arrangements for the past 2 months: Single Family Home                                       Social Determinants of Health (SDOH) Interventions    Readmission Risk Interventions No flowsheet data found.

## 2020-09-18 NOTE — Progress Notes (Signed)
PROGRESS NOTE    Sophia Mendez  [id number]:[id number] DOB: [birth year] DOA: 09/10/2020 PCP: Pcp, No    Brief Narrative:  Patient admitted to the hospital with a working diagnosis of acute hypoxic respiratory failure due to SARS COVID-19 viral pneumonia.  43 year old female with significant past medical history for obesity class III and chronic back pain who presents with cough and dyspnea.  Positive Covid contacts at home.  She reported nausea, vomiting, diarrhea, loss of taste and smell along with dyspnea and cough.  She had worsening dyspnea 2 to 3 days prior to hospitalization.  EMS was called and her oxygenation was found to be less than 50%.  On her initial physical examination blood pressure was 119/82, heart rate 86, respirate 27, oxygenation 88% to 93% on 6 L per high flow nasal cannula plus nonrebreather mask. Chest film with with interstitial and alveolar infiltrates, right lower lobe, left lower lobe left upper lobe.  Patient has been treated with systemic corticosteroids, remdesivir and baricitinib. She initially required high flow oxygen 13 L/min to keep oxygen saturation more than 88%.  CT chest with bilateral infiltrates but not PE, lower extremities negative for DVT.   1. Acute hypoxic respiratory failure due to SARS covid 19 viral pneumonia. #5 Remdesivir.   RR: 21-24  Pulse oxymetry: 95 to 98  Fi02: 4L/ min per [place]   COVID-19 Labs  Recent Labs    09/17/20 0243 09/18/20 0302  DDIMER 1.88* 1.34*    Lab Results  Component Value Date   SARSCOV2NAA POSITIVE (A) 09/10/2020    Patient's dyspnea and fatigue are better but not yet back to baseline, positive appetite.   Continue medical therapy with barcitinib #8, systemic steroids with methylprednisolone 60 mg Iv q12 and bronchodilators. Decreased oxygen requirements, will decrease methylprednisolone to 60 mg IV daily and will add combivent scheduled. Encourage out of bed to chair and prone position as tolerated.   Continue  airway clearing techniques and antitussive agents. PT and OT.  Follow on inflammatory markers.   OK to transfer to medical ward.   2. Steroid induced hyperglycemia. Fasting glucose this am 200, with capillary, 176, 194, 152.  Continue insulin sliding scale and will check on HgA1c. Patient is tolerating po well.   3. Obesity class 3/ chronic hypercapnic respiratory failure/ possible obesity hypoventilation syndrome.  Continue out of bed to chair, PT and OT evaluation. Will need further work up as outpatient.   4. Hx of menorrhagia. Stable, no active bleeding.   Assessment & Plan:   Principal Problem:   Pneumonia due to COVID-19 virus Active Problems:   Acute hypoxemic respiratory failure due to COVID-19 [facility])   Morbid obesity with BMI of 50.0-59.9, adult (HCC)   Steroid-induced hyperglycemia   Obesity hypoventilation syndrome ([place])    Patient continue to be at high risk for worsening hypoxemic respiratory failure.   Status is: Inpatient  Remains inpatient appropriate because:IV treatments appropriate due to intensity of illness or inability to take PO   Dispo:  Patient From: Home  Planned Disposition: Home with Health Care Svc  Expected discharge date: 09/18/20  Medically stable for discharge: No    DVT prophylaxis: Enoxaparin  High dose to to high BMI Code Status:   full  Family Communication:  No family at the bedside, patient will call her family for update today.      Subjective: Patient continue to be very weak and deconditioned, dyspnea has improved but not yet back to baseline, no nausea or vomiting.  Objective: Vitals:   09/18/20 0600 09/18/20 0700 09/18/20 0800 09/18/20 0818  BP:    (!) 147/76  Pulse: (!) 59 (!) 58 (!) 58 64  Resp: (!) 24 (!) 24 (!) 21 20  Temp:   (!) 97.5 F (36.4 C)   TempSrc:   Oral   SpO2: 95% 95% 98% 95%  Weight:      Height:       No intake or output data in the 24 hours ending 09/18/20 0841 Filed Weights   09/10/20 1538    Weight: (!) 154.5 kg    Examination:   General: Not in pain or dyspnea, deconditioned  Neurology: Awake and alert, non focal  E ENT: mild pallor, no icterus, oral mucosa moist Cardiovascular: No JVD. S1-S2 present, rhythmic, no gallops, rubs, or murmurs. No lower extremity edema. Pulmonary: positive breath sounds bilaterally,  Gastrointestinal. Abdomen protuberant and non tender Skin. No rashes Musculoskeletal: no joint deformities     Data Reviewed: I have personally reviewed following labs and imaging studies  CBC: Recent Labs  Lab 09/12/20 0235 09/13/20 0250 09/14/20 0254 09/15/20 0331  WBC 9.8 7.7 9.5 9.4  NEUTROABS 7.4 5.8 6.8 5.6  HGB 11.6* 11.9* 12.3 11.5*  HCT 39.3 41.1 41.3 37.8  MCV 98.5 100.5* 97.4 95.9  PLT 363 332 379 761   Basic Metabolic Panel: Recent Labs  Lab 09/12/20 0235 09/12/20 0235 09/13/20 0250 09/14/20 0254 09/15/20 0331 09/17/20 0243 09/18/20 0302  NA 136   < > 141 144 147* 143 138  K 4.5   < > 5.1 4.3 3.9 3.7 4.4  CL 96*   < > 97* 100 97* 97* 97*  CO2 32   < > 35* 34* 36* 36* 32  GLUCOSE 135*   < > 115* 120* 86 94 200*  BUN 32*   < > 26* 25* 18 22* 20  CREATININE 1.01*   < > 0.95 0.79 0.74 0.77 0.77  CALCIUM 8.1*   < > 8.5* 8.7* 9.1 8.4* 8.6*  MG 2.5*  --  2.7* 2.4 1.9 2.2  --   PHOS 3.3  --  3.1 2.8 3.3 4.5  --    < > = values in this interval not displayed.   GFR: Estimated Creatinine Clearance: 134 mL/min (by C-G formula based on SCr of 0.77 mg/dL). Liver Function Tests: Recent Labs  Lab 09/13/20 0250 09/14/20 0254 09/15/20 0331 09/17/20 0243 09/18/20 0302  AST 52* 49* 38 31 21  ALT 41 48* 48* 37 34  ALKPHOS 49 55 58 41 39  BILITOT 0.5 0.5 0.6 0.7 0.7  PROT 6.9 6.9 7.2 6.2* 6.2*  ALBUMIN 2.8* 3.0* 3.2* 3.0* 2.9*   No results for input(s): LIPASE, AMYLASE in the last 168 hours. No results for input(s): AMMONIA in the last 168 hours. Coagulation Profile: No results for input(s): INR, PROTIME in the last 168  hours. Cardiac Enzymes: No results for input(s): CKTOTAL, CKMB, CKMBINDEX, TROPONINI in the last 168 hours. BNP (last 3 results) No results for input(s): PROBNP in the last 8760 hours. HbA1C: No results for input(s): HGBA1C in the last 72 hours. CBG: Recent Labs  Lab 09/17/20 0755 09/17/20 1144 09/17/20 1625 09/17/20 2148 09/18/20 0807  GLUCAP 82 105* 176* 194* 152*   Lipid Profile: No results for input(s): CHOL, HDL, LDLCALC, TRIG, CHOLHDL, LDLDIRECT in the last 72 hours. Thyroid Function Tests: No results for input(s): TSH, T4TOTAL, FREET4, T3FREE, THYROIDAB in the last 72 hours. Anemia Panel: No results for input(s):  VITAMINB12, FOLATE, FERRITIN, TIBC, IRON, RETICCTPCT in the last 72 hours.    Radiology Studies: I have reviewed all of the imaging during this hospital visit personally     Scheduled Meds: . vitamin C  500 mg Oral Daily  . baricitinib  4 mg Oral Daily  . Chlorhexidine Gluconate Cloth  6 each Topical Daily  . enoxaparin (LOVENOX) injection  80 mg Subcutaneous Q24H  . insulin aspart  0-20 Units Subcutaneous TID WC  . insulin aspart  0-5 Units Subcutaneous QHS  . methylPREDNISolone (SOLU-MEDROL) injection  60 mg Intravenous Q12H  . sodium chloride flush  3 mL Intravenous Q12H  . sodium chloride flush  3 mL Intravenous Q12H  . zinc sulfate  220 mg Oral Daily   Continuous Infusions: . sodium chloride       LOS: 8 days        Tallan Sandoz Gerome Apley, MD

## 2020-09-19 LAB — FERRITIN: Ferritin: 68 ng/mL (ref 11–307)

## 2020-09-19 LAB — COMPREHENSIVE METABOLIC PANEL
ALT: 41 U/L (ref 0–44)
AST: 32 U/L (ref 15–41)
Albumin: 3.1 g/dL — ABNORMAL LOW (ref 3.5–5.0)
Alkaline Phosphatase: 38 U/L (ref 38–126)
Anion gap: 11 (ref 5–15)
BUN: 18 mg/dL (ref 6–20)
CO2: 32 mmol/L (ref 22–32)
Calcium: 8.8 mg/dL — ABNORMAL LOW (ref 8.9–10.3)
Chloride: 100 mmol/L (ref 98–111)
Creatinine, Ser: 0.67 mg/dL (ref 0.44–1.00)
GFR calc Af Amer: >60 mL/min (ref >60)
GFR calc non Af Amer: >60 mL/min (ref >60)
Glucose, Bld: 101 mg/dL — ABNORMAL HIGH (ref 70–99)
Potassium: 3.9 mmol/L (ref 3.5–5.1)
Sodium: 143 mmol/L (ref 135–145)
Total Bilirubin: 0.7 mg/dL (ref 0.3–1.2)
Total Protein: 6 g/dL — ABNORMAL LOW (ref 6.5–8.1)

## 2020-09-19 LAB — GLUCOSE, CAPILLARY
Glucose-Capillary: 73 mg/dL (ref 70–99)
Glucose-Capillary: 110 mg/dL — ABNORMAL HIGH (ref 70–99)
Glucose-Capillary: 240 mg/dL — ABNORMAL HIGH (ref 70–99)
Glucose-Capillary: 159 mg/dL — ABNORMAL HIGH (ref 70–99)

## 2020-09-19 LAB — C-REACTIVE PROTEIN: CRP: <0.5 mg/dL (ref <1.0)

## 2020-09-19 LAB — HEMOGLOBIN A1C
Hgb A1c MFr Bld: 6.7 % — ABNORMAL HIGH (ref 4.8–5.6)
Mean Plasma Glucose: 145.59 mg/dL

## 2020-09-19 LAB — D-DIMER, QUANTITATIVE: D-Dimer, Quant: 1.68 ug/mL-FEU — ABNORMAL HIGH (ref 0.00–0.50)

## 2020-09-19 MED ORDER — METHYLPREDNISOLONE SODIUM SUCC 40 MG IJ SOLR
40.0000 mg | Freq: Every day | INTRAMUSCULAR | Status: DC
  Administered 2020-09-20: 40 mg via INTRAVENOUS
  Filled 2020-09-19: qty 1

## 2020-09-19 NOTE — Progress Notes (Signed)
PROGRESS NOTE    Sophia Mendez  OJJ:[id number] DOB: May 15, 1976 DOA: 09/10/2020 PCP: Pcp, No    Brief Narrative:  Patient admitted to the hospital with a working diagnosis of acute hypoxic respiratory failure due to SARS COVID-19 viral pneumonia.  44 year old female with significant past medical history for obesity class III and chronic back pain who presents with cough and dyspnea.  Positive Covid contacts at home.  She reported nausea, vomiting, diarrhea, loss of taste and smell along with dyspnea and cough.  She had worsening dyspnea 2 to 3 days prior to hospitalization.  EMS was called and her oxygenation was found to be less than 50%.  On her initial physical examination blood pressure was 119/82, heart rate 86, respirate 27, oxygenation 88% to 93% on 6 L per high flow nasal cannula plus nonrebreather mask. Chest film with with interstitial and alveolar infiltrates, right lower lobe, left lower lobe left upper lobe.  Patient has been treated with systemic corticosteroids, remdesivir and baricitinib. She initially required high flow oxygen 13 L/min to keep oxygen saturation more than 88%.  CT chest with bilateral infiltrates but not PE, lower extremities negative for DVT   Assessment & Plan:   Principal Problem:   Pneumonia due to COVID-19 virus Active Problems:   Acute hypoxemic respiratory failure due to COVID-19 [facility])   Morbid obesity with BMI of 50.0-59.9, adult (HCC)   Steroid-induced hyperglycemia   Obesity hypoventilation syndrome ([place])    1. Acute hypoxic respiratory failure due to SARS covid 19 viral pneumonia. #5 Remdesivir.   RR: 20  Pulse oxymetry: 97%  Fi02: 2 L/min   Patient has been recovering well, but not yet back to baseline, will continue medical therapy with baricitinib and will decrease systemic steroids to 40 mg daily of methylprednisolone.  Continue bronchodilators, antitussive agents and airway clearing techniques.  Out of bed to chair. Check  ambulatory oxymetry on room air for possible discharge in am.   2. Steroid induced hyperglycemia. Fasting glucose this am 101 mg/dl. Patient is tolerating po well. hgb A1c is 6,7 consistent with diabetes mellitus type 2. Continue glucose cover and monitoring with insulin sliding scale. Taper steroids.   3. Obesity class 3/ chronic hypercapnic respiratory failure/ possible obesity hypoventilation syndrome.  Continue to encourage mobility. Out of bed to chair.  Positive oxygen desaturation on ambulation down to 86 to 87%, patient will need home 02.   4. Hx of menorrhagia. No active bleeding.   =  Status is: Inpatient  Remains inpatient appropriate because:IV treatments appropriate due to intensity of illness or inability to take PO   Dispo:  Patient From: Home  Planned Disposition: Home with Health Care Svc  Expected discharge date: 09/24  Medically stable for discharge: No    DVT prophylaxis: Enoxaparin   Code Status:   full  Family Communication:   I spoke over the phone with the patient's husband about patient's  condition, plan of care, prognosis and all questions were addressed.   Subjective: Patient is feeling better but not yet back to baseline, continue to have weakness and dyspnea on exertion, no nausea or vomiting, now transferred to the medical ward.   Objective: Vitals:   09/18/20 2139 09/19/20 0747 09/19/20 1051 09/19/20 1130  BP: (!) 157/65  (!) 151/77 138/89  Pulse: 64  66 (!) 59  Resp: 16  19 20   Temp:    98.2 F (36.8 C)  TempSrc:      SpO2: (!) 84% 97% 95% 97%  Weight:  Height:        Intake/Output Summary (Last 24 hours) at 09/19/2020 1255 Last data filed at 09/19/2020 1000 Gross per 24 hour  Intake 860 ml  Output --  Net 860 ml   Filed Weights   09/10/20 1538  Weight: (!) 154.5 kg    Examination:   General: Not in pain or dyspnea, deconditioned  Neurology: Awake and alert, non focal  E ENT: no pallor, no icterus, oral mucosa  moist Cardiovascular: No JVD. S1-S2 present, rhythmic, no gallops, rubs, or murmurs. No lower extremity edema. Pulmonary: positive breath sounds bilaterally, Gastrointestinal. Abdomen soft and non tender Skin. No rashes Musculoskeletal: no joint deformities     Data Reviewed: I have personally reviewed following labs and imaging studies  CBC: Recent Labs  Lab 09/13/20 0250 09/14/20 0254 09/15/20 0331  WBC 7.7 9.5 9.4  NEUTROABS 5.8 6.8 5.6  HGB 11.9* 12.3 11.5*  HCT 41.1 41.3 37.8  MCV 100.5* 97.4 95.9  PLT 332 379 798   Basic Metabolic Panel: Recent Labs  Lab 09/13/20 0250 09/13/20 0250 09/14/20 0254 09/15/20 0331 09/17/20 0243 09/18/20 0302 09/19/20 0257  NA 141   < > 144 147* 143 138 143  K 5.1   < > 4.3 3.9 3.7 4.4 3.9  CL 97*   < > 100 97* 97* 97* 100  CO2 35*   < > 34* 36* 36* 32 32  GLUCOSE 115*   < > 120* 86 94 200* 101*  BUN 26*   < > 25* 18 22* 20 18  CREATININE 0.95   < > 0.79 0.74 0.77 0.77 0.67  CALCIUM 8.5*   < > 8.7* 9.1 8.4* 8.6* 8.8*  MG 2.7*  --  2.4 1.9 2.2  --   --   PHOS 3.1  --  2.8 3.3 4.5  --   --    < > = values in this interval not displayed.   GFR: Estimated Creatinine Clearance: 134 mL/min (by C-G formula based on SCr of 0.67 mg/dL). Liver Function Tests: Recent Labs  Lab 09/14/20 0254 09/15/20 0331 09/17/20 0243 09/18/20 0302 09/19/20 0257  AST 49* 38 31 21 32  ALT 48* 48* 37 34 41  ALKPHOS 55 58 41 39 38  BILITOT 0.5 0.6 0.7 0.7 0.7  PROT 6.9 7.2 6.2* 6.2* 6.0*  ALBUMIN 3.0* 3.2* 3.0* 2.9* 3.1*   No results for input(s): LIPASE, AMYLASE in the last 168 hours. No results for input(s): AMMONIA in the last 168 hours. Coagulation Profile: No results for input(s): INR, PROTIME in the last 168 hours. Cardiac Enzymes: No results for input(s): CKTOTAL, CKMB, CKMBINDEX, TROPONINI in the last 168 hours. BNP (last 3 results) No results for input(s): PROBNP in the last 8760 hours. HbA1C: Recent Labs    09/19/20 0257  HGBA1C  6.7*   CBG: Recent Labs  Lab 09/18/20 0807 09/18/20 1142 09/18/20 1648 09/18/20 2118 09/19/20 0752  GLUCAP 152* 163* 114* 87 73   Lipid Profile: No results for input(s): CHOL, HDL, LDLCALC, TRIG, CHOLHDL, LDLDIRECT in the last 72 hours. Thyroid Function Tests: No results for input(s): TSH, T4TOTAL, FREET4, T3FREE, THYROIDAB in the last 72 hours. Anemia Panel: Recent Labs    09/19/20 0257  FERRITIN 25      Radiology Studies: I have reviewed all of the imaging during this hospital visit personally     Scheduled Meds: . vitamin C  500 mg Oral Daily  . baricitinib  4 mg Oral Daily  . Chlorhexidine  Gluconate Cloth  6 each Topical Daily  . chlorpheniramine-HYDROcodone  5 mL Oral Q12H  . enoxaparin (LOVENOX) injection  80 mg Subcutaneous Q24H  . insulin aspart  0-20 Units Subcutaneous TID WC  . insulin aspart  0-5 Units Subcutaneous QHS  . Ipratropium-Albuterol  1 puff Inhalation Q6H  . methylPREDNISolone (SOLU-MEDROL) injection  60 mg Intravenous Daily  . sodium chloride flush  3 mL Intravenous Q12H  . sodium chloride flush  3 mL Intravenous Q12H  . zinc sulfate  220 mg Oral Daily   Continuous Infusions: . sodium chloride       LOS: 9 days        Roderica Cathell Gerome Apley, MD

## 2020-09-19 NOTE — Progress Notes (Signed)
SATURATION QUALIFICATIONS: (This note is used to comply with regulatory documentation for home oxygen)  Patient Saturations on Room Air at Rest = 91%  Patient Saturations on Room Air while Ambulating = 77%  Patient Saturations on 3 Liters of oxygen while Ambulating = 91%  Please briefly explain why patient needs home oxygen: Pt with increase WOB, HR elevated to 113, O2 sat to 77% and c/o lightheadedness with attempts to mobilize on RA.

## 2020-09-19 NOTE — Progress Notes (Signed)
Physical Therapy Treatment Patient Details Name: Sophia Mendez MRN: [id number] DOB: 28-Feb-1976 Today's Date: 09/19/2020    History of Present Illness Patient is 44 y.o. female with PMH significant for chronic back and neck pain, and morbid obesity, sleep apnea, and obesity hypoventilation syndrome. Pt admitted with shortness of breath and cough.  Symptoms started ~ 1 week PTA and worsened for 2 to 3 days.  Pt unvaccinated and COVID-19 positive on 9/7 in [facility] ED; saturations 50%.  Started on high flow nasal cannula and nonrebreather.    PT Comments    Pt very cooperative and up to walk increased distance in hall for O2 sat test.  Pt desat to 77% on RA with c/o mild dizziness.  See Sat qualification in separate note this date.   Follow Up Recommendations  Supervision - Intermittent;No PT follow up     Equipment Recommendations  None recommended by PT    Recommendations for Other Services       Precautions / Restrictions Precautions Precautions: Fall Precaution Comments: monitor vitals currently 2L O2 Restrictions Weight Bearing Restrictions: No    Mobility  Bed Mobility Overal bed mobility: Modified Independent                Transfers Overall transfer level: Modified independent Equipment used: None Transfers: Sit to/from Stand Sit to Stand: Modified independent (Device/Increase time)            Ambulation/Gait Ambulation/Gait assistance: Supervision Gait Distance (Feet): 180 Feet Assistive device: None Gait Pattern/deviations: Step-through pattern;Decreased stride length;Wide base of support Gait velocity: decr   General Gait Details: steady gait, decreased pace, wide BOS, increased WOB on RA   Stairs             Wheelchair Mobility    Modified Rankin (Stroke Patients Only)       Balance Overall balance assessment: Needs assistance Sitting-balance support: Feet supported Sitting balance-Leahy Scale: Good     Standing balance support: No  upper extremity supported Standing balance-Leahy Scale: Fair Standing balance comment: fair to good, not tested to mod challenges                            Cognition Arousal/Alertness: Awake/alert Behavior During Therapy: WFL for tasks assessed/performed Overall Cognitive Status: Within Functional Limits for tasks assessed                                 General Comments: slow to respond at times      Exercises      General Comments General comments (skin integrity, edema, etc.): O2 sat test - see separate note      Pertinent Vitals/Pain Pain Assessment: No/denies pain    Home Living                      Prior Function            PT Goals (current goals can now be found in the care plan section) Acute Rehab PT Goals Patient Stated Goal: feel better PT Goal Formulation: With patient Time For Goal Achievement: 09/27/20 Potential to Achieve Goals: Good Progress towards PT goals: Progressing toward goals    Frequency    Min 3X/week      PT Plan Current plan remains appropriate    Co-evaluation              AM-PAC PT "6  Clicks" Mobility   Outcome Measure  Help needed turning from your back to your side while in a flat bed without using bedrails?: None Help needed moving from lying on your back to sitting on the side of a flat bed without using bedrails?: None Help needed moving to and from a bed to a chair (including a wheelchair)?: None Help needed standing up from a chair using your arms (e.g., wheelchair or bedside chair)?: None Help needed to walk in hospital room?: A Little Help needed climbing 3-5 steps with a railing? : A Little 6 Click Score: 22    End of Session Equipment Utilized During Treatment: Oxygen Activity Tolerance: Patient tolerated treatment well Patient left: with call bell/phone within reach;in bed Nurse Communication: Mobility status PT Visit Diagnosis: Difficulty in walking, not elsewhere  classified (R26.2);Unsteadiness on feet (R26.81);Muscle weakness (generalized) (M62.81)     Time: 8588-5027 PT Time Calculation (min) (ACUTE ONLY): 16 min  Charges:  $Gait Training: 8-22 mins                     [place] Pager [phone] Office [phone]    Moriyah [place] 09/19/2020, 4:13 PM

## 2020-09-20 LAB — COMPREHENSIVE METABOLIC PANEL
ALT: 37 U/L (ref 0–44)
AST: 22 U/L (ref 15–41)
Albumin: 3 g/dL — ABNORMAL LOW (ref 3.5–5.0)
Alkaline Phosphatase: 39 U/L (ref 38–126)
Anion gap: 11 (ref 5–15)
BUN: 20 mg/dL (ref 6–20)
CO2: 30 mmol/L (ref 22–32)
Calcium: 8.4 mg/dL — ABNORMAL LOW (ref 8.9–10.3)
Chloride: 100 mmol/L (ref 98–111)
Creatinine, Ser: 0.69 mg/dL (ref 0.44–1.00)
GFR calc Af Amer: >60 mL/min (ref >60)
GFR calc non Af Amer: >60 mL/min (ref >60)
Glucose, Bld: 109 mg/dL — ABNORMAL HIGH (ref 70–99)
Potassium: 4.7 mmol/L (ref 3.5–5.1)
Sodium: 141 mmol/L (ref 135–145)
Total Bilirubin: 0.4 mg/dL (ref 0.3–1.2)
Total Protein: 6.1 g/dL — ABNORMAL LOW (ref 6.5–8.1)

## 2020-09-20 LAB — GLUCOSE, CAPILLARY
Glucose-Capillary: 103 mg/dL — ABNORMAL HIGH (ref 70–99)
Glucose-Capillary: 127 mg/dL — ABNORMAL HIGH (ref 70–99)

## 2020-09-20 LAB — D-DIMER, QUANTITATIVE: D-Dimer, Quant: 1.13 ug/mL-FEU — ABNORMAL HIGH (ref 0.00–0.50)

## 2020-09-20 LAB — C-REACTIVE PROTEIN: CRP: 0.6 mg/dL (ref <1.0)

## 2020-09-20 LAB — FERRITIN: Ferritin: 60 ng/mL (ref 11–307)

## 2020-09-20 MED ORDER — ALBUTEROL SULFATE HFA 108 (90 BASE) MCG/ACT IN AERS: 2.0000 | INHALATION_SPRAY | RESPIRATORY_TRACT | 0 refills | Status: AC | PRN

## 2020-09-20 MED ORDER — GUAIFENESIN-DM 100-10 MG/5ML PO SYRP: 5.0000 mL | ORAL_SOLUTION | Freq: Four times a day (QID) | ORAL | 0 refills | Status: AC | PRN

## 2020-09-20 NOTE — Progress Notes (Signed)
Physical Therapy Treatment Patient Details Name: Sophia Mendez MRN: [id number] DOB: [birth year] Today's Date: 09/20/2020    History of Present Illness Patient is 44 y.o. female with PMH significant for chronic back and neck pain, and morbid obesity, sleep apnea, and obesity hypoventilation syndrome. Pt admitted with shortness of breath and cough.  Symptoms started ~ 1 week PTA and worsened for 2 to 3 days.  Pt unvaccinated and COVID-19 positive on 9/7 in [facility] ED; saturations 50%.  Started on high flow nasal cannula and nonrebreather.    PT Comments    Patient making excellent progress with therapy. She was able to advance gait distance today to 210' with 2 standing breaks for to rest. Pt required 2L/min during gait to maintain O2 saturations at 88% or higher. VSS stable throughout. Patient was able to safely manage portable O2 during gait with supervision. She will benefit from follow up PT in acute setting, and has no follow up PT needs when medically ready to discharge home.   SATURATION QUALIFICATIONS: (This note is used to comply with regulatory documentation for home oxygen)  Patient Saturations on Room Air at Rest = 84%  Patient Saturations on Room Air while Ambulating = 74%  Patient Saturations on 2 Liters of oxygen while Ambulating = 88-94%%  Please briefly explain why patient needs home oxygen: Pt required 2L/min during gait to maintain O2 saturations at 88% or higher. VSS stable throughout.    Follow Up Recommendations  Supervision - Intermittent;No PT follow up     Equipment Recommendations  None recommended by PT    Recommendations for Other Services       Precautions / Restrictions Precautions Precautions: Fall Precaution Comments: monitor vitals currently 2L O2 Restrictions Weight Bearing Restrictions: No    Mobility  Bed Mobility Overal bed mobility: Independent             General bed mobility comments: seated at side of bed.  Transfers Overall  transfer level: Independent Equipment used: None Transfers: Sit to/from Stand Sit to Stand: Modified independent (Device/Increase time)         General transfer comment: pt using single UE for power from EOB and demonstrates safe reach back to recliner. pt steady when rising.  Ambulation/Gait Ambulation/Gait assistance: Supervision Gait Distance (Feet): 210 Feet Assistive device:  (oxygen cannister) Gait Pattern/deviations: Step-through pattern;Decreased stride length;Wide base of support Gait velocity: decr   General Gait Details: pt stedy with gait, no overt LOB noted, pt continues with slow pace. ambulated increasd distance with 2 standing rest breaks in hallway. pt desaturated on RA to 74% during gait. O2 increased to 2L/min and pt improved to 88% or greater. EOS pt saturating at 88-94% on 2L/min.    Stairs             Wheelchair Mobility    Modified Rankin (Stroke Patients Only)       Balance Overall balance assessment: Mild deficits observed, not formally tested Sitting-balance support: Feet supported Sitting balance-Leahy Scale: Good     Standing balance support: No upper extremity supported;Single extremity supported Standing balance-Leahy Scale: Good               Cognition Arousal/Alertness: Awake/alert Behavior During Therapy: WFL for tasks assessed/performed Overall Cognitive Status: Within Functional Limits for tasks assessed                   Exercises      General Comments        Pertinent Vitals/Pain Pain Assessment:  No/denies pain Pain Location: reports hx of chronic back pain and headache with coughing Pain Descriptors / Indicators: Aching Pain Intervention(s): Monitored during session;Limited activity within patient's tolerance;Repositioned           PT Goals (current goals can now be found in the care plan section) Acute Rehab PT Goals Patient Stated Goal: feel better PT Goal Formulation: With patient Time For Goal  Achievement: 09/27/20 Potential to Achieve Goals: Good Progress towards PT goals: Progressing toward goals    Frequency    Min 3X/week      PT Plan Current plan remains appropriate       AM-PAC PT "6 Clicks" Mobility   Outcome Measure  Help needed turning from your back to your side while in a flat bed without using bedrails?: None Help needed moving from lying on your back to sitting on the side of a flat bed without using bedrails?: None Help needed moving to and from a bed to a chair (including a wheelchair)?: None Help needed standing up from a chair using your arms (e.g., wheelchair or bedside chair)?: None Help needed to walk in hospital room?: A Little Help needed climbing 3-5 steps with a railing? : A Little 6 Click Score: 22    End of Session Equipment Utilized During Treatment: Oxygen Activity Tolerance: Patient tolerated treatment well Patient left: in chair;with call bell/phone within reach Nurse Communication: Mobility status PT Visit Diagnosis: Difficulty in walking, not elsewhere classified (R26.2);Unsteadiness on feet (R26.81);Muscle weakness (generalized) (M62.81)     Time: 3212-2482 PT Time Calculation (min) (ACUTE ONLY): 18 min  Charges:  $Gait Training: 8-22 mins                    Verner Mould, DPT Acute Rehabilitation Services  Office [phone] Pager [phone]  09/20/2020 4:56 PM

## 2020-09-20 NOTE — Discharge Summary (Signed)
Physician Discharge Summary  Sophia Mendez GHW:[id number] DOB: 20-Sep-1976 DOA: 09/10/2020  PCP: Pcp, No  Admit date: 09/10/2020 Discharge date: 09/20/2020  Admitted From: Home  Disposition:  Home   Recommendations for Outpatient Follow-up and new medication changes:  1. Follow up with Primary care in 2 weeks. 2. Continue as needed antitussive agents and albuterol. 3. Continue self quarantine for 2 weeks, use a mask in public and maintain physical distancing, is recommended to get covid vaccine when recovered as outpatient per primary care.   Home Health: no   Equipment/Devices: home 02    Discharge Condition: stable  CODE STATUS: full  Diet recommendation: regular.   Brief/Interim Summary: Patient admitted to the hospital with a working diagnosis of acute hypoxic respiratory failure due to SARS COVID-19 viral pneumonia.  44 year old female with significant past medical history for obesity class III and chronic back pain who presents with cough and dyspnea. Positive Covid contacts at home. She reported nausea, vomiting, diarrhea, loss of taste and smell along with dyspnea and cough. She had worsening dyspnea for 2 to 3 days prior to hospitalization. EMS was called and her oxygenation was found to be less than 50%. On her initial physical examination blood pressure was 119/82, heart rate 86, respirate 27, oxygenation 88% to 93% on 6 L per high flow nasal cannula plus nonrebreather mask.  Her lungs had diffuse rhonchi, and increased respiratory effort, heart S1-S2, present rhythm, abdomen protuberant, no lower extremity edema. Sodium 138, potassium 3.7, chloride 95, bicarb 29, glucose 164, BUN 18, creatinine 0.99, white count 7.6, hemoglobin 10.8, hematocrit 35.6, platelets 345.  SARS COVID-19 positive. EKG 98 bpm, normal axis, normal intervals, sinus rhythm, no ST segment or T wave changes. Chest film withwith interstitial and alveolar infiltrates, right lower lobe, left lower lobe left  upper lobe.  Patient has been treated with systemic corticosteroids, remdesivir and baricitinib.Sheinitially requiredhigh flow oxygen 13 L/min to keep oxygen saturation more than 88%.  CT chest with bilateral infiltrates but not PE, lower extremities negative for DVT  1.  Acute hypoxic respiratory failure due to SARS COVID-19 viral pneumonia. Patient was initially admitted to the progressive care unit, she received aggressive medical therapy with systemic steroids, remdesivir and baricitinib. She was treated with bronchodilator therapy, antitussive agents and airway clearing techniques with good toleration. Her symptoms, inflammatory markers and oxygenation improved.  COVID-19 Labs  Recent Labs    09/18/20 0302 09/19/20 0257 09/20/20 0352  DDIMER 1.34* 1.68* 1.13*  FERRITIN  --  68 60  CRP  --  <0.5 0.6    Lab Results  Component Value Date   SARSCOV2NAA POSITIVE (A) 09/10/2020   Her oxygenation at discharge is 96 to 97% on 2 L per nasal cannula.  At rest on room air her oxygenation was 91%, on ambulation 77%, required 3 L of submental oxygen with ambulation to reach oxygen saturation 91%.  Patient will continue recovery at home, home oxygen to use 2 L at rest and 3 L on ambulation.  As needed antitussive agents and bronchodilators.  2.  Steroid-induced hyperglycemia, type 2 diabetes mellitus, hemoglobin A1c 6.7.  Patient received insulin therapy for glucose control with good toleration.  At discharge steroids will be discontinued.  Patient will follow-up as an outpatient, for now continue with diet controlled.  3.  Obesity class III/chronic hypercapnic respiratory failure, possible obesity hypoventilation syndrome.  Patient will need follow-up as an outpatient, once fully recover it is recommended her to get a sleep study.  4.  History of menorrhagia.  No active bleeding.  Discharge Diagnoses:  Principal Problem:   Pneumonia due to COVID-19 virus Active Problems:   Acute  hypoxemic respiratory failure due to COVID-19 [facility])   Morbid obesity with BMI of 50.0-59.9, adult (HCC)   Steroid-induced hyperglycemia   Obesity hypoventilation syndrome ([place])    Discharge Instructions   Allergies as of 09/20/2020   No Known Allergies     Medication List    TAKE these medications   acetaminophen 500 MG tablet Commonly known as: TYLENOL Take 1,000 mg by mouth every 6 (six) hours as needed.   albuterol 108 (90 Base) MCG/ACT inhaler Commonly known as: VENTOLIN HFA Inhale 2 puffs into the lungs every 2 (two) hours as needed for wheezing or shortness of breath.   guaiFENesin-dextromethorphan 100-10 MG/5ML syrup Commonly known as: ROBITUSSIN DM Take 5 mLs by mouth every 6 (six) hours as needed for cough.   ibuprofen 200 MG tablet Commonly known as: ADVIL Take 400-600 mg by mouth every 6 (six) hours as needed.            Durable Medical Equipment  (From admission, onward)         Start     Ordered   09/20/20 0829  For home use only DME oxygen  Once       Comments: Use 3 L/min on ambulation.  Question Answer Comment  Length of Need 6 Months   Mode or (Route) Nasal cannula   Liters per Minute 2   Frequency Continuous (stationary and portable oxygen unit needed)   Oxygen delivery system Gas      09/20/20 0829          No Known Allergies      Procedures/Studies: CT ANGIO CHEST PE W OR WO CONTRAST  Result Date: 09/15/2020 CLINICAL DATA:  COVID-19 pneumonia with hypoxia and elevated D-dimer. Possible pulmonary embolism. EXAM: CT ANGIOGRAPHY CHEST WITH CONTRAST TECHNIQUE: Multidetector CT imaging of the chest was performed using the standard protocol during bolus administration of intravenous contrast. Multiplanar CT image reconstructions and MIPs were obtained to evaluate the vascular anatomy. CONTRAST:  161mL OMNIPAQUE IOHEXOL 350 MG/ML SOLN COMPARISON:  None. FINDINGS: Several factors limiting optimal imaging including patient on 15 L  high-flow oxygen as well as positional IV as had to image patient with arm positioned by side. Cardiovascular: Mild cardiomegaly. Thoracic aorta is normal in caliber. Pulmonary arterial system demonstrates no evidence of pulmonary emboli. Mediastinum/Nodes: No mediastinal or hilar adenopathy. Remaining mediastinal structures are normal. Lungs/Pleura: Lungs are adequately inflated demonstrate patchy bilateral hazy airspace process compatible with known COVID-19 pneumonia. No effusion. Airways are unremarkable. Upper Abdomen: No acute findings. Musculoskeletal: No focal abnormality. Review of the MIP images confirms the above findings. IMPRESSION: 1. No evidence of pulmonary embolism. 2. Patchy bilateral hazy airspace process compatible with known COVID-19 pneumonia. 3. Mild cardiomegaly. Electronically Signed   By: Marin Olp M.D.   On: 09/15/2020 16:35   DG Chest Port 1 View  Result Date: 09/10/2020 CLINICAL DATA:  COVID-19, shortness of breath. EXAM: PORTABLE CHEST 1 VIEW COMPARISON:  November 29, 2015. FINDINGS: Stable cardiomegaly. No pneumothorax is noted. Increased bilateral perihilar and basilar opacities are noted concerning for pneumonia. No definite pleural effusion is noted. Bony thorax is unremarkable. IMPRESSION: Increased bilateral perihilar and basilar opacities are noted concerning for pneumonia. Electronically Signed   By: Marijo Conception M.D.   On: 09/10/2020 13:17   VAS [place] LOWER EXTREMITY VENOUS (DVT)  Result Date: 09/16/2020  Lower Venous DVTStudy Indications: Swelling.  Limitations: Body habitus and poor ultrasound/tissue interface. Comparison Study: No prior studies. Performing Technologist: Darlin Coco  Examination Guidelines: A complete evaluation includes B-mode imaging, spectral Doppler, color Doppler, and power Doppler as needed of all accessible portions of each vessel. Bilateral testing is considered an integral part of a complete examination. Limited examinations for  reoccurring indications may be performed as noted. The reflux portion of the exam is performed with the patient in reverse Trendelenburg.  +---------+---------------+---------+-----------+----------+--------------+ RIGHT    CompressibilityPhasicitySpontaneityPropertiesThrombus Aging +---------+---------------+---------+-----------+----------+--------------+ CFV      Full           Yes      Yes                                 +---------+---------------+---------+-----------+----------+--------------+ SFJ      Full                                                        +---------+---------------+---------+-----------+----------+--------------+ FV Prox  Full                                                        +---------+---------------+---------+-----------+----------+--------------+ FV Mid   Full                                                        +---------+---------------+---------+-----------+----------+--------------+ FV DistalFull                                                        +---------+---------------+---------+-----------+----------+--------------+ PFV      Full                                                        +---------+---------------+---------+-----------+----------+--------------+ POP      Full           Yes      Yes                                 +---------+---------------+---------+-----------+----------+--------------+ PTV      Full                                                        +---------+---------------+---------+-----------+----------+--------------+ PERO     Full                                                        +---------+---------------+---------+-----------+----------+--------------+   +---------+---------------+---------+-----------+----------+---------------+  LEFT     CompressibilityPhasicitySpontaneityPropertiesThrombus Aging   +---------+---------------+---------+-----------+----------+---------------+ CFV      Full           Yes      No                                   +---------+---------------+---------+-----------+----------+---------------+ SFJ      Full                                                         +---------+---------------+---------+-----------+----------+---------------+ FV Prox  Full                                                         +---------+---------------+---------+-----------+----------+---------------+ FV Mid                  Yes      Yes                  Patent by color +---------+---------------+---------+-----------+----------+---------------+ FV Distal               Yes      Yes                  Patent by color +---------+---------------+---------+-----------+----------+---------------+ PFV      Full                                                         +---------+---------------+---------+-----------+----------+---------------+ POP      Full           Yes      Yes                                  +---------+---------------+---------+-----------+----------+---------------+ PTV      Full                                                         +---------+---------------+---------+-----------+----------+---------------+ PERO     Full                                                         +---------+---------------+---------+-----------+----------+---------------+     Summary: RIGHT: - There is no evidence of deep vein thrombosis in the lower extremity. However, portions of this examination were limited- see technologist comments above.  - No cystic structure found in the popliteal fossa.  LEFT: - There is no evidence of deep vein thrombosis in the lower extremity. However, portions of this examination were limited- see technologist comments above.  -  No cystic structure found in the popliteal fossa.  *See table(s) above for measurements and  observations. Electronically signed by Ruta Hinds MD on 09/16/2020 at 5:20:48 PM.    Final        Subjective: Patient is feeling better, dyspnea has been improving, no nausea or vomiting, no chest pain.   Discharge Exam: Vitals:   09/19/20 2346 09/20/20 0548  BP: (!) 143/77 137/69  Pulse: 65 86  Resp: 20 20  Temp: 98.6 F (37 C) 98 F (36.7 C)  SpO2: 96% 97%   Vitals:   09/19/20 1651 09/19/20 1953 09/19/20 2346 09/20/20 0548  BP: (!) 142/64 (!) 147/60 (!) 143/77 137/69  Pulse: 63 62 65 86  Resp: 20 18 20 20   Temp: 98.1 F (36.7 C) 98.6 F (37 C) 98.6 F (37 C) 98 F (36.7 C)  TempSrc: Oral     SpO2: 98% 94% 96% 97%  Weight:      Height:        General: Not in pain or dyspnea,  Neurology: Awake and alert, non focal  E ENT: no pallor, no icterus, oral mucosa moist Cardiovascular: No JVD. S1-S2 present, rhythmic, no gallops, rubs, or murmurs. No lower extremity edema. Pulmonary: positive breath sounds bilaterally, Gastrointestinal. Abdomen soft and non tender Skin. No rashes Musculoskeletal: no joint deformities   The results of significant diagnostics from this hospitalization (including imaging, microbiology, ancillary and laboratory) are listed below for reference.     Microbiology: Recent Results (from the past 240 hour(s))  Blood Culture (routine x 2)     Status: None   Collection Time: 09/10/20 12:09 PM   Specimen: BLOOD  Result Value Ref Range Status   Specimen Description   Final    BLOOD LEFT ANTECUBITAL Performed at [facility] [address] [place], [place] [address]    Special Requests   Final    BOTTLES DRAWN AEROBIC AND ANAEROBIC Blood Culture adequate volume Performed at [facility] [address], [place], [place] [address]    Culture   Final    NO GROWTH 5 DAYS Performed at [facility], [place] [address] [place], [place] [address]    Report Status 09/15/2020 FINAL  Final  Blood  Culture (routine x 2)     Status: Abnormal   Collection Time: 09/10/20 12:25 PM   Specimen: BLOOD  Result Value Ref Range Status   Specimen Description   Final    BLOOD RIGHT ANTECUBITAL Performed at [facility] [address], [place], [place] [address]    Special Requests   Final    BOTTLES DRAWN AEROBIC AND ANAEROBIC Blood Culture adequate volume Performed at [facility] [address] [place], [place] [address]    Culture  Setup Time   Final    GRAM POSITIVE COCCI IN CLUSTERS AEROBIC BOTTLE ONLY CRITICAL RESULT CALLED TO, READ BACK BY AND VERIFIED WITH: PHARMD MICHELLE L. [phone] FCP    Culture (A)  Final    STAPHYLOCOCCUS HOMINIS THE SIGNIFICANCE OF ISOLATING THIS ORGANISM FROM A SINGLE SET OF BLOOD CULTURES WHEN MULTIPLE SETS ARE DRAWN IS UNCERTAIN. PLEASE NOTIFY THE MICROBIOLOGY DEPARTMENT WITHIN ONE WEEK IF SPECIATION AND SENSITIVITIES ARE REQUIRED. Performed at [facility], [place] [address] [place],  [address]    Report Status 09/12/2020 FINAL  Final  Blood Culture ID Panel (Reflexed)     Status: Abnormal   Collection Time: 09/10/20 12:25 PM  Result  Value Ref Range Status   Enterococcus faecalis NOT DETECTED NOT DETECTED Final   Enterococcus Faecium NOT DETECTED NOT DETECTED Final   Listeria monocytogenes NOT DETECTED NOT DETECTED Final   Staphylococcus species DETECTED (A) NOT DETECTED Final    Comment: CRITICAL RESULT CALLED TO, READ BACK BY AND VERIFIED WITH: PHARMD MICHELLE L. [phone] FCP    Staphylococcus aureus (BCID) NOT DETECTED NOT DETECTED Final   Staphylococcus epidermidis NOT DETECTED NOT DETECTED Final   Staphylococcus lugdunensis NOT DETECTED NOT DETECTED Final   Streptococcus species NOT DETECTED NOT DETECTED Final   Streptococcus agalactiae NOT DETECTED NOT DETECTED Final   Streptococcus pneumoniae NOT DETECTED NOT DETECTED Final   Streptococcus pyogenes NOT DETECTED NOT DETECTED Final    A.calcoaceticus-baumannii NOT DETECTED NOT DETECTED Final   Bacteroides fragilis NOT DETECTED NOT DETECTED Final   Enterobacterales NOT DETECTED NOT DETECTED Final   Enterobacter cloacae complex NOT DETECTED NOT DETECTED Final   Escherichia coli NOT DETECTED NOT DETECTED Final   Klebsiella aerogenes NOT DETECTED NOT DETECTED Final   Klebsiella oxytoca NOT DETECTED NOT DETECTED Final   Klebsiella pneumoniae NOT DETECTED NOT DETECTED Final   Proteus species NOT DETECTED NOT DETECTED Final   Salmonella species NOT DETECTED NOT DETECTED Final   Serratia marcescens NOT DETECTED NOT DETECTED Final   Haemophilus influenzae NOT DETECTED NOT DETECTED Final   Neisseria meningitidis NOT DETECTED NOT DETECTED Final   Pseudomonas aeruginosa NOT DETECTED NOT DETECTED Final   Stenotrophomonas maltophilia NOT DETECTED NOT DETECTED Final   Candida albicans NOT DETECTED NOT DETECTED Final   Candida auris NOT DETECTED NOT DETECTED Final   Candida glabrata NOT DETECTED NOT DETECTED Final   Candida krusei NOT DETECTED NOT DETECTED Final   Candida parapsilosis NOT DETECTED NOT DETECTED Final   Candida tropicalis NOT DETECTED NOT DETECTED Final   Cryptococcus neoformans/gattii NOT DETECTED NOT DETECTED Final    Comment: Performed at [facility], [address], [place], [place] [address]  SARS Coronavirus 2 by RT PCR (hospital order, performed in [facility] hospital lab) Nasopharyngeal Nasopharyngeal Swab     Status: Abnormal   Collection Time: 09/10/20  2:40 PM   Specimen: Nasopharyngeal Swab  Result Value Ref Range Status   SARS Coronavirus 2 POSITIVE (A) NEGATIVE Final    Comment: RESULT CALLED TO, READ BACK BY AND VERIFIED WITH: WEST,S. RN @1638  ON 09.14.2021 BY COHEN,K (NOTE) SARS-CoV-2 target nucleic acids are DETECTED  SARS-CoV-2 RNA is generally detectable in upper respiratory specimens  during the acute phase of infection.  Positive results are indicative  of the presence of the  identified virus, but do not rule out bacterial infection or co-infection with other pathogens not detected by the test.  Clinical correlation with patient history and  other diagnostic information is necessary to determine patient infection status.  The expected result is negative.  Fact Sheet for Patients:   StrictlyIdeas.no   Fact Sheet for Healthcare Providers:   BankingDealers.co.za    This test is not yet approved or cleared by the [place] FDA and  has been authorized for detection and/or diagnosis of SARS-CoV-2 by FDA under an Emergency Use Authorization (EUA).  This EUA will remain in effect (meaning  this test can be used) for the duration of  the COVID-19 declaration under Section 564(b)(1) of the Act, 21 U.S.C. section 360-bbb-3(b)(1), unless the authorization is terminated or revoked sooner.  Performed at [facility], [place] [address], [place], [place] [address]  MRSA PCR Screening     Status: None   Collection Time: 09/12/20  3:48 PM   Specimen: Nasal Mucosa; Nasopharyngeal  Result Value Ref Range Status   MRSA by PCR NEGATIVE NEGATIVE Final    Comment:        The GeneXpert MRSA Assay (FDA approved for NASAL specimens only), is one component of a comprehensive MRSA colonization surveillance program. It is not intended to diagnose MRSA infection nor to guide or monitor treatment for MRSA infections. Performed at [facility], [place] [address], [place], [place] [address]      Labs: BNP (last 3 results) No results for input(s): BNP in the last 8760 hours. Basic Metabolic Panel: Recent Labs  Lab 09/14/20 0254 09/14/20 0254 09/15/20 0331 09/17/20 0243 09/18/20 0302 09/19/20 0257 09/20/20 0352  NA 144   < > 147* 143 138 143 141  K 4.3   < > 3.9 3.7 4.4 3.9 4.7  CL 100   < > 97* 97* 97* 100 100  CO2 34*   < > 36* 36* 32 32 30  GLUCOSE 120*   < > 86 94 200* 101* 109*   BUN 25*   < > 18 22* 20 18 20   CREATININE 0.79   < > 0.74 0.77 0.77 0.67 0.69  CALCIUM 8.7*   < > 9.1 8.4* 8.6* 8.8* 8.4*  MG 2.4  --  1.9 2.2  --   --   --   PHOS 2.8  --  3.3 4.5  --   --   --    < > = values in this interval not displayed.   Liver Function Tests: Recent Labs  Lab 09/15/20 0331 09/17/20 0243 09/18/20 0302 09/19/20 0257 09/20/20 0352  AST 38 31 21 32 22  ALT 48* 37 34 41 37  ALKPHOS 58 41 39 38 39  BILITOT 0.6 0.7 0.7 0.7 0.4  PROT 7.2 6.2* 6.2* 6.0* 6.1*  ALBUMIN 3.2* 3.0* 2.9* 3.1* 3.0*   No results for input(s): LIPASE, AMYLASE in the last 168 hours. No results for input(s): AMMONIA in the last 168 hours. CBC: Recent Labs  Lab 09/14/20 0254 09/15/20 0331  WBC 9.5 9.4  NEUTROABS 6.8 5.6  HGB 12.3 11.5*  HCT 41.3 37.8  MCV 97.4 95.9  PLT 379 393   Cardiac Enzymes: No results for input(s): CKTOTAL, CKMB, CKMBINDEX, TROPONINI in the last 168 hours. BNP: Invalid input(s): POCBNP CBG: Recent Labs  Lab 09/19/20 0752 09/19/20 1127 09/19/20 1648 09/19/20 1954 09/20/20 0804  GLUCAP 73 110* 240* 159* 103*   D-Dimer Recent Labs    09/19/20 0257 09/20/20 0352  DDIMER 1.68* 1.13*   Hgb A1c Recent Labs    09/19/20 0257  HGBA1C 6.7*   Lipid Profile No results for input(s): CHOL, HDL, LDLCALC, TRIG, CHOLHDL, LDLDIRECT in the last 72 hours. Thyroid function studies No results for input(s): TSH, T4TOTAL, T3FREE, THYROIDAB in the last 72 hours.  Invalid input(s): FREET3 Anemia work up [employer]    09/19/20 0257 09/20/20 0352  FERRITIN 68 60   Urinalysis No results found for: COLORURINE, APPEARANCEUR, [place], [place], GLUCOSEU, [place], [place], [place], [place], UROBILINOGEN, NITRITE, LEUKOCYTESUR Sepsis Labs Invalid input(s): PROCALCITONIN,  WBC,  LACTICIDVEN Microbiology Recent Results (from the past 240 hour(s))  Blood Culture (routine x 2)     Status: None   Collection Time: 09/10/20 12:09 PM   Specimen: BLOOD   Result Value Ref Range Status   Specimen Description   Final  BLOOD LEFT ANTECUBITAL Performed at [facility] [address], [place], [place] [address]    Special Requests   Final    BOTTLES DRAWN AEROBIC AND ANAEROBIC Blood Culture adequate volume Performed at [facility] [address], [place], [place] [address]    Culture   Final    NO GROWTH 5 DAYS Performed at [facility], [place] [address] [place], [place] [address]    Report Status 09/15/2020 FINAL  Final  Blood Culture (routine x 2)     Status: Abnormal   Collection Time: 09/10/20 12:25 PM   Specimen: BLOOD  Result Value Ref Range Status   Specimen Description   Final    BLOOD RIGHT ANTECUBITAL Performed at [facility] [address], [place], [place] [address]    Special Requests   Final    BOTTLES DRAWN AEROBIC AND ANAEROBIC Blood Culture adequate volume Performed at [facility] [address], [place], [place] [address]    Culture  Setup Time   Final    GRAM POSITIVE COCCI IN CLUSTERS AEROBIC BOTTLE ONLY CRITICAL RESULT CALLED TO, READ BACK BY AND VERIFIED WITH: PHARMD MICHELLE L. [phone] FCP    Culture (A)  Final    STAPHYLOCOCCUS HOMINIS THE SIGNIFICANCE OF ISOLATING THIS ORGANISM FROM A SINGLE SET OF BLOOD CULTURES WHEN MULTIPLE SETS ARE DRAWN IS UNCERTAIN. PLEASE NOTIFY THE MICROBIOLOGY DEPARTMENT WITHIN ONE WEEK IF SPECIATION AND SENSITIVITIES ARE REQUIRED. Performed at [facility], [place] [address], [place], [place] [address]    Report Status 09/12/2020 FINAL  Final  Blood Culture ID Panel (Reflexed)     Status: Abnormal   Collection Time: 09/10/20 12:25 PM  Result Value Ref Range Status   Enterococcus faecalis NOT DETECTED NOT DETECTED Final   Enterococcus Faecium NOT DETECTED NOT DETECTED Final   Listeria monocytogenes NOT DETECTED NOT DETECTED Final   Staphylococcus species DETECTED (A) NOT DETECTED  Final    Comment: CRITICAL RESULT CALLED TO, READ BACK BY AND VERIFIED WITH: PHARMD MICHELLE L. [phone] FCP    Staphylococcus aureus (BCID) NOT DETECTED NOT DETECTED Final   Staphylococcus epidermidis NOT DETECTED NOT DETECTED Final   Staphylococcus lugdunensis NOT DETECTED NOT DETECTED Final   Streptococcus species NOT DETECTED NOT DETECTED Final   Streptococcus agalactiae NOT DETECTED NOT DETECTED Final   Streptococcus pneumoniae NOT DETECTED NOT DETECTED Final   Streptococcus pyogenes NOT DETECTED NOT DETECTED Final   A.calcoaceticus-baumannii NOT DETECTED NOT DETECTED Final   Bacteroides fragilis NOT DETECTED NOT DETECTED Final   Enterobacterales NOT DETECTED NOT DETECTED Final   Enterobacter cloacae complex NOT DETECTED NOT DETECTED Final   Escherichia coli NOT DETECTED NOT DETECTED Final   Klebsiella aerogenes NOT DETECTED NOT DETECTED Final   Klebsiella oxytoca NOT DETECTED NOT DETECTED Final   Klebsiella pneumoniae NOT DETECTED NOT DETECTED Final   Proteus species NOT DETECTED NOT DETECTED Final   Salmonella species NOT DETECTED NOT DETECTED Final   Serratia marcescens NOT DETECTED NOT DETECTED Final   Haemophilus influenzae NOT DETECTED NOT DETECTED Final   Neisseria meningitidis NOT DETECTED NOT DETECTED Final   Pseudomonas aeruginosa NOT DETECTED NOT DETECTED Final   Stenotrophomonas maltophilia NOT DETECTED NOT DETECTED Final   Candida albicans NOT DETECTED NOT DETECTED Final   Candida auris NOT DETECTED NOT DETECTED Final   Candida glabrata NOT DETECTED NOT DETECTED Final   Candida krusei NOT DETECTED NOT DETECTED Final   Candida parapsilosis NOT  DETECTED NOT DETECTED Final   Candida tropicalis NOT DETECTED NOT DETECTED Final   Cryptococcus neoformans/gattii NOT DETECTED NOT DETECTED Final    Comment: Performed at [facility], [place] [address] [place], [place] [address]  SARS Coronavirus 2 by RT PCR (hospital order, performed in [facility] hospital lab)  Nasopharyngeal Nasopharyngeal Swab     Status: Abnormal   Collection Time: 09/10/20  2:40 PM   Specimen: Nasopharyngeal Swab  Result Value Ref Range Status   SARS Coronavirus 2 POSITIVE (A) NEGATIVE Final    Comment: RESULT CALLED TO, READ BACK BY AND VERIFIED WITH: WEST,S. RN @1638  ON 09.14.2021 BY COHEN,K (NOTE) SARS-CoV-2 target nucleic acids are DETECTED  SARS-CoV-2 RNA is generally detectable in upper respiratory specimens  during the acute phase of infection.  Positive results are indicative  of the presence of the identified virus, but do not rule out bacterial infection or co-infection with other pathogens not detected by the test.  Clinical correlation with patient history and  other diagnostic information is necessary to determine patient infection status.  The expected result is negative.  Fact Sheet for Patients:   StrictlyIdeas.no   Fact Sheet for Healthcare Providers:   BankingDealers.co.za    This test is not yet approved or cleared by the [place] FDA and  has been authorized for detection and/or diagnosis of SARS-CoV-2 by FDA under an Emergency Use Authorization (EUA).  This EUA will remain in effect (meaning  this test can be used) for the duration of  the COVID-19 declaration under Section 564(b)(1) of the Act, 21 U.S.C. section 360-bbb-3(b)(1), unless the authorization is terminated or revoked sooner.  Performed at [facility], [place] [address], [place], [place] [address]   MRSA PCR Screening     Status: None   Collection Time: 09/12/20  3:48 PM   Specimen: Nasal Mucosa; Nasopharyngeal  Result Value Ref Range Status   MRSA by PCR NEGATIVE NEGATIVE Final    Comment:        The GeneXpert MRSA Assay (FDA approved for NASAL specimens only), is one component of a comprehensive MRSA colonization surveillance program. It is not intended to diagnose MRSA infection nor to guide or monitor  treatment for MRSA infections. Performed at [facility], [place] [address], [place], [place] [address]      Time coordinating discharge: 45 minutes  SIGNED:   Tawni Millers, MD  [facility] 09/20/2020, 8:32 AM

## 2020-09-20 NOTE — Progress Notes (Signed)
Occupational Therapy Treatment Patient Details Name: Sophia Mendez MRN: [id number] DOB: [birth year] Today's Date: 09/20/2020    History of present illness Patient is 44 y.o. female with PMH significant for chronic back and neck pain, and morbid obesity, sleep apnea, and obesity hypoventilation syndrome. Pt admitted with shortness of breath and cough.  Symptoms started ~ 1 week PTA and worsened for 2 to 3 days.  Pt unvaccinated and COVID-19 positive on 9/7 in [facility] ED; saturations 50%.  Started on high flow nasal cannula and nonrebreather.   OT comments  Patient seated on side of bed on 2L [place] with o2 sat 95% when therapist entered the room. Patient reports she will be discharging today. Therapist educated patient on energy conservation principles and provided examples on how to perform tasks on return home. Therapist recommended short frequent bouts of activity with time to recover, a shower chair for safety, and use of chair for rest breaks when performing IADLs. Patient verbalizes understanding. Patient ambulated to sink to perform grooming task on room air. Reports ambulating to bathroom on room air and without nursing assistance while in hospital. Patient stood at sink to perform grooming task. Patient's o2 sat dropped to 75% after ambulation, standing grooming and return to side of bed. Replaced oxygen and sat up to 92% within one minute. Patient denies shortness of breath or fatigue. Patient has met her OT goals     Follow Up Recommendations  No OT follow up    Equipment Recommendations  Tub/shower seat    Recommendations for Other Services      Precautions / Restrictions Precautions Precautions: Fall Precaution Comments: monitor vitals currently 2L O2 Restrictions Weight Bearing Restrictions: No       Mobility Bed Mobility Overal bed mobility: Independent             General bed mobility comments: seated at side of bed.  Transfers Overall transfer level: Independent                General transfer comment: Independent to ambulate in room without device.    Balance Overall balance assessment: No apparent balance deficits (not formally assessed)                                         ADL either performed or assessed with clinical judgement   ADL Overall ADL's : Needs assistance/impaired     Grooming: Wash/dry face;Oral care;Wash/dry hands;Standing;Independent               Lower Body Dressing: Modified independent Lower Body Dressing Details (indicate cue type and reason): Able to donn shoes.   Toilet Transfer Details (indicate cue type and reason): Reports independence while in hospital without assistane of nursing staff.   Toileting - Clothing Manipulation Details (indicate cue type and reason): Reports independence while in hospital without assistane of nursing staff.             Vision Baseline Vision/History: No visual deficits     Perception     Praxis      Cognition Arousal/Alertness: Awake/alert Behavior During Therapy: WFL for tasks assessed/performed Overall Cognitive Status: Within Functional Limits for tasks assessed                                          Exercises  Shoulder Instructions       General Comments      Pertinent Vitals/ Pain       Pain Assessment: No/denies pain  Home Living                                          Prior Functioning/Environment              Frequency  Min 2X/week        Progress Toward Goals  OT Goals(current goals can now be found in the care plan section)  Progress towards OT goals: Progressing toward goals  Acute Rehab OT Goals Patient Stated Goal: feel better OT Goal Formulation: With patient Time For Goal Achievement: 09/28/20 Potential to Achieve Goals: Good  Plan Discharge plan remains appropriate    Co-evaluation                 AM-PAC OT "6 Clicks" Daily Activity     Outcome  Measure   Help from another person eating meals?: None Help from another person taking care of personal grooming?: None Help from another person toileting, which includes using toliet, bedpan, or urinal?: None Help from another person bathing (including washing, rinsing, drying)?: None Help from another person to put on and taking off regular upper body clothing?: None Help from another person to put on and taking off regular lower body clothing?: None 6 Click Score: 24    End of Session Equipment Utilized During Treatment: Oxygen  OT Visit Diagnosis: Other abnormalities of gait and mobility (R26.89)   Activity Tolerance Patient tolerated treatment well   Patient Left Other (comment);with call bell/phone within reach (seated at side of bed)   Nurse Communication  (okay to see per RN)        Time: 3953-2023 OT Time Calculation (min): 17 min  Charges: OT General Charges $OT Visit: 1 Visit OT Treatments $Self Care/Home Management : 8-22 mins  Derl Barrow, OTR/L [place]  Office [phone] Pager: [place] 09/20/2020, 1:23 PM

## 2020-09-20 NOTE — Progress Notes (Addendum)
AVS summary reviewed with pt. Pt voiced understanding on all new medications and discharge instructions.  IV access removed. Dressing intact. Awaiting oxygen. All patient's belongings at bedside. Patient's friend will transport pt home.

## 2020-09-20 NOTE — TOC Transition Note (Signed)
Transition of Care [facility]) - CM/SW Discharge Note   Patient Details  Name: Sophia Mendez MRN: [id number] Date of Birth: August 17, 1976  [facility] of [facility]) CM/SW Contact:  Dessa Phi, RN Phone Number: 09/20/2020, 12:12 PM   Clinical Narrative: Ordered for home 02-[employer] rep Zach aware of order to deliver home 02 travel tank to rm prior d/c. No further CM needs.      Final next level of care: Home/Self Care Barriers to Discharge: No Barriers Identified   Patient Goals and CMS Choice Patient states their goals for this hospitalization and ongoing recovery are:: to go home CMS Medicare.gov Compare Post Acute Care list provided to:: Patient    Discharge Placement                       Discharge Plan and Services   Discharge Planning Services: CM Consult            DME Arranged: Oxygen DME Agency: [employer] Date DME Agency Contacted: 09/20/20 Time DME Agency Contacted: 1211 Representative spoke with at DME Agency: [place] ([place]) Interventions     Readmission Risk Interventions No flowsheet data found.

## 2020-09-20 NOTE — Progress Notes (Signed)
Home oxygen delivered and placed on patient.  All belongings with patient.  Transport by wheelchair and mask intact on patient. Patients transport home awaiting outside.

## 2021-01-12 IMAGING — DX DG CHEST 1V PORT
1 series · 1 of 1 positions shown · non-contrast
Comparison: November 29, 2015.

CLINICAL DATA: [id number], shortness of breath.

EXAM:
PORTABLE CHEST 1 VIEW

[chest ap]
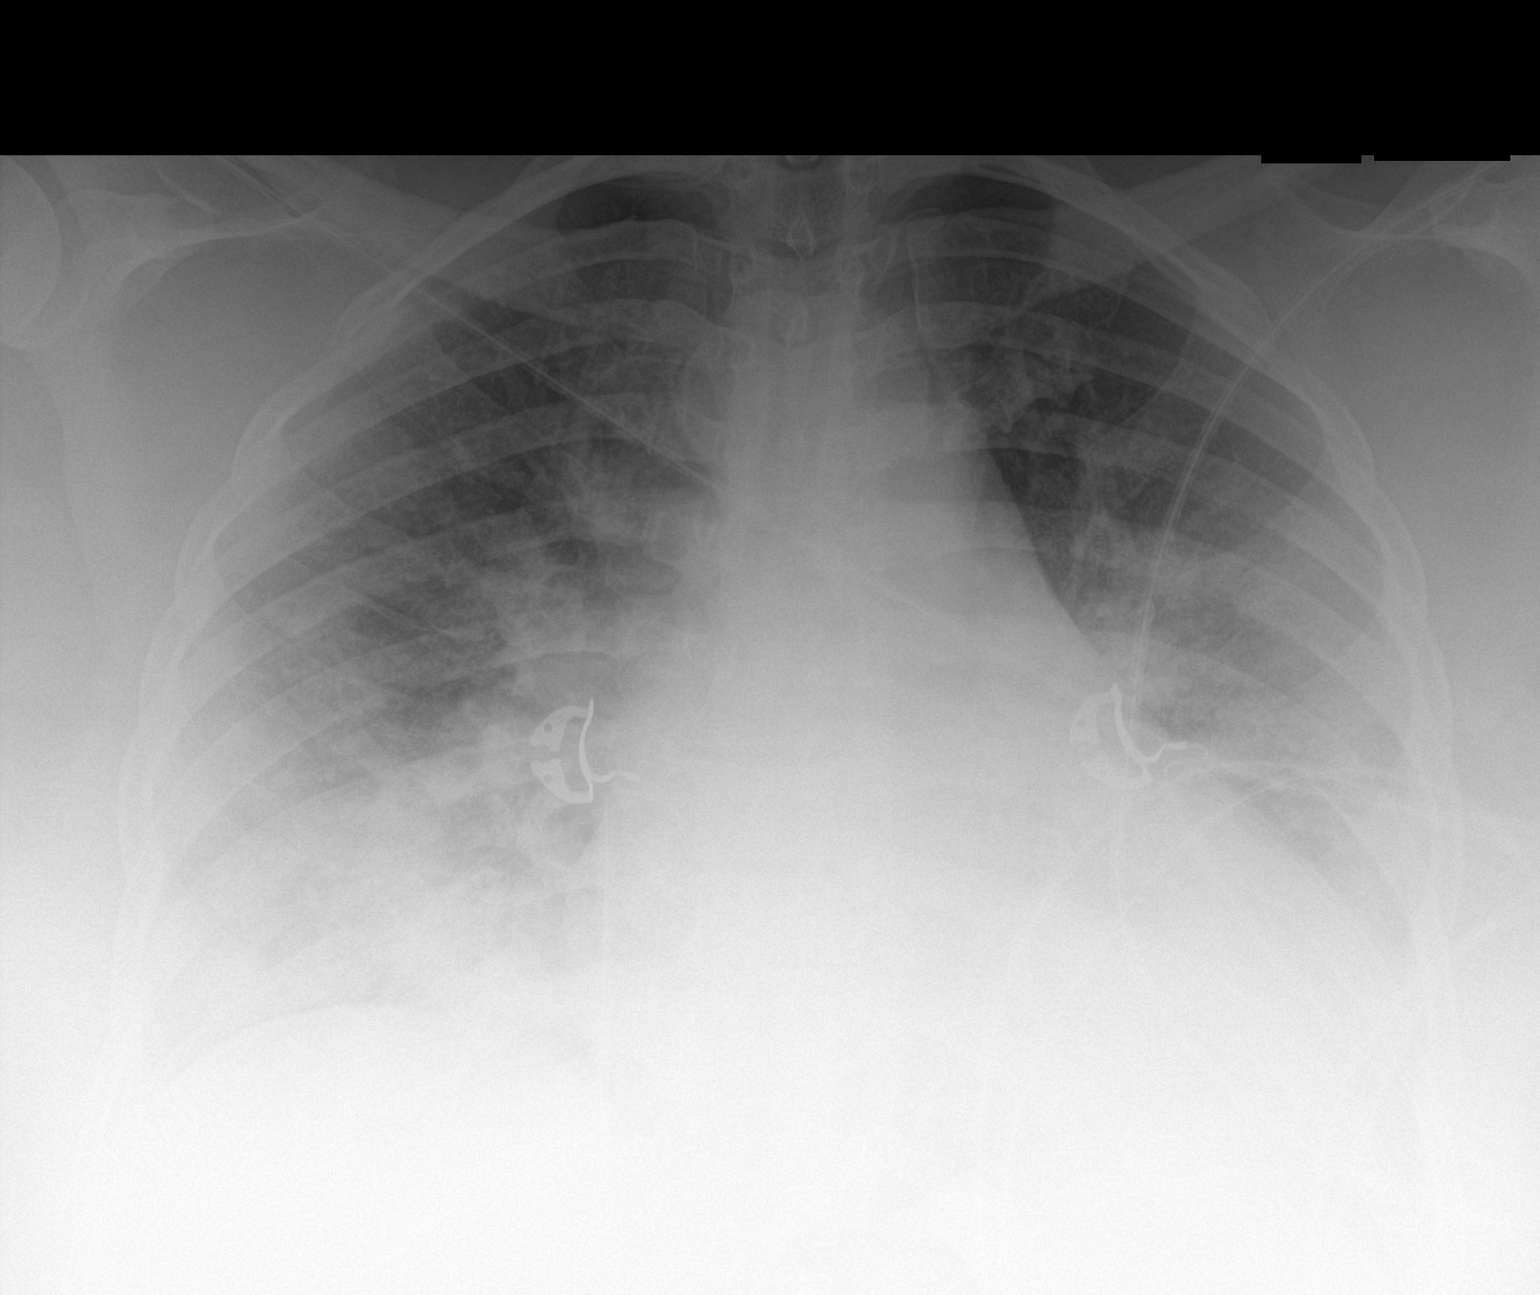

[1 of 1 positions shown; findings below may reference images not displayed]

FINDINGS: Stable cardiomegaly. No pneumothorax is noted. Increased bilateral
perihilar and basilar opacities are noted concerning for pneumonia.
No definite pleural effusion is noted. Bony thorax is unremarkable.
IMPRESSION: Increased bilateral perihilar and basilar opacities are noted
concerning for pneumonia.

## 2021-01-17 IMAGING — CT CT ANGIO CHEST
2 of 7 series · 18 of 46 positions shown · IV contrast (OMNIPAQUE)
Comparison: None.

CLINICAL DATA: RFND7-DG pneumonia with hypoxia and elevated
D-dimer. Possible pulmonary embolism.

EXAM:
CT ANGIOGRAPHY CHEST WITH CONTRAST
TECHNIQUE: Multidetector CT imaging of the chest was performed using the
standard protocol during bolus administration of intravenous
contrast. Multiplanar CT image reconstructions and MIPs were
obtained to evaluate the vascular anatomy.
CONTRAST:  100mL OMNIPAQUE IOHEXOL 350 MG/ML SOLN

[Series 5: thins · axial · 0.74mm/px · z∈[+1611,+1849]mm · 15 of 272 slices shown]
[im 17/272  lung]
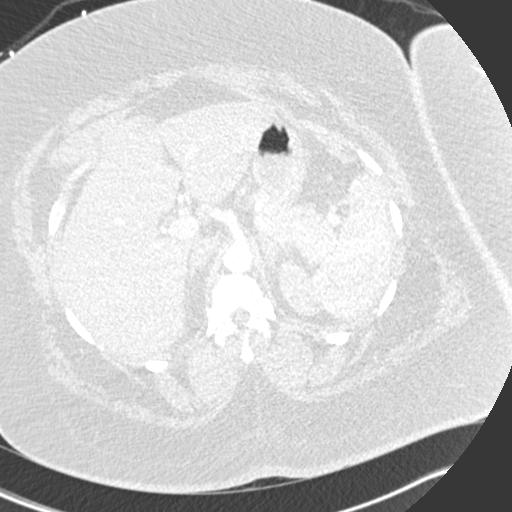
[im 34/272  soft-tissue]
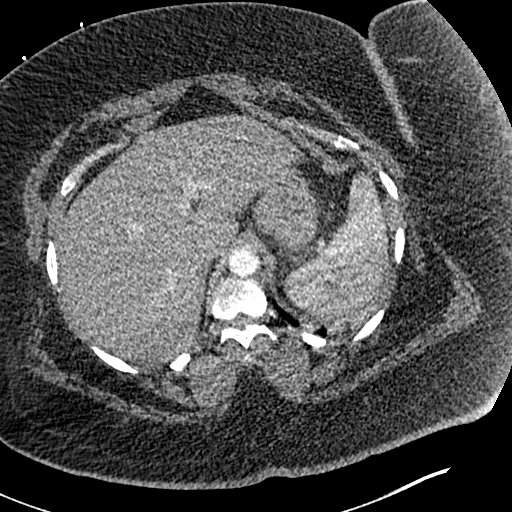
[im 51/272  lung]
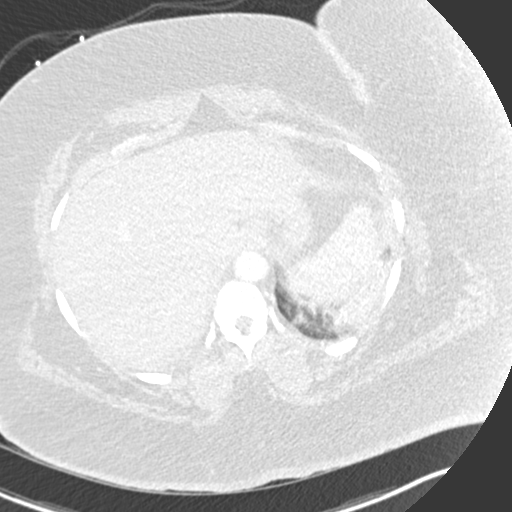
[im 68/272  soft-tissue]
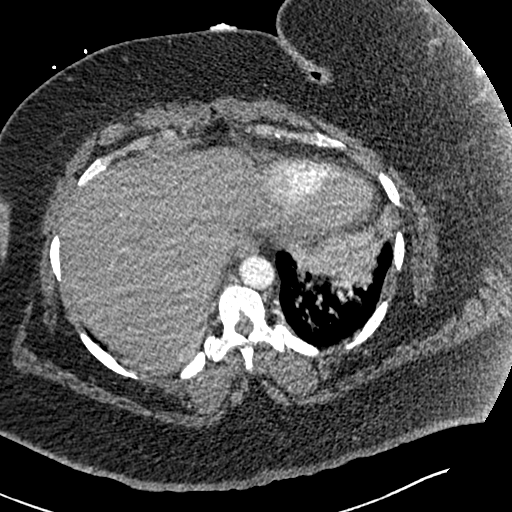
[im 85/272  lung]
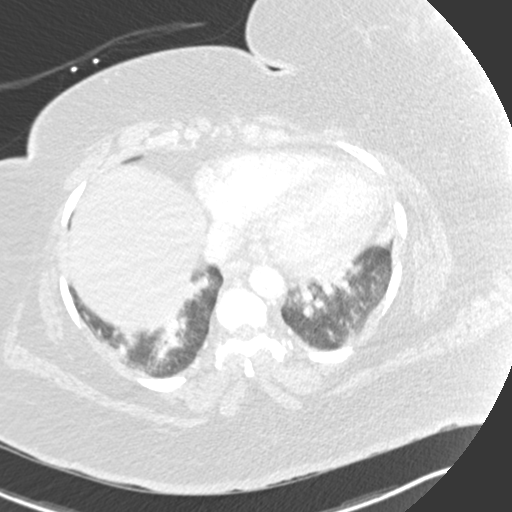
[im 102/272  soft-tissue]
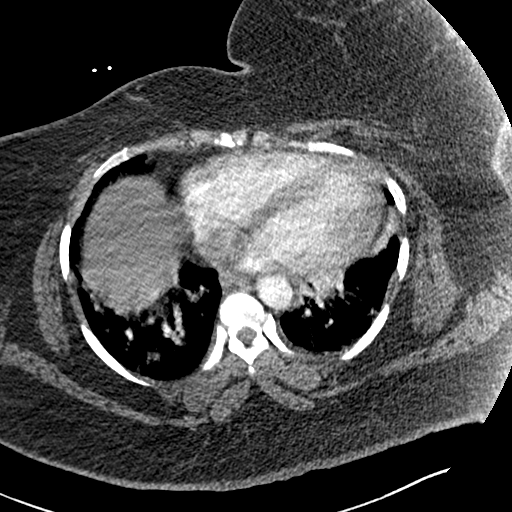
[im 119/272  lung]
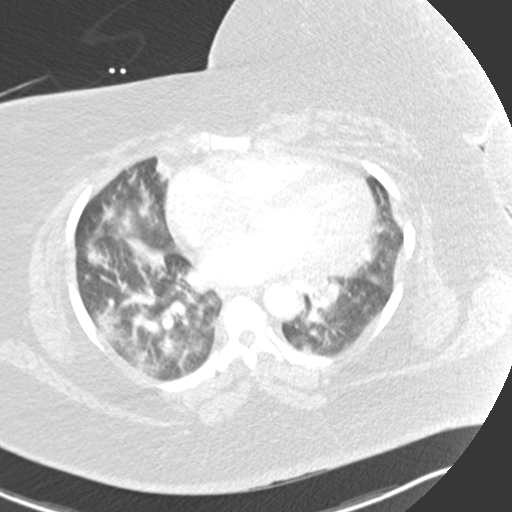
[im 136/272  soft-tissue]
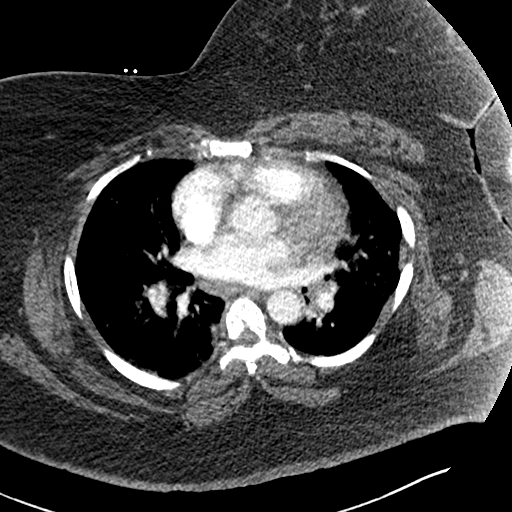
[im 153/272  lung]
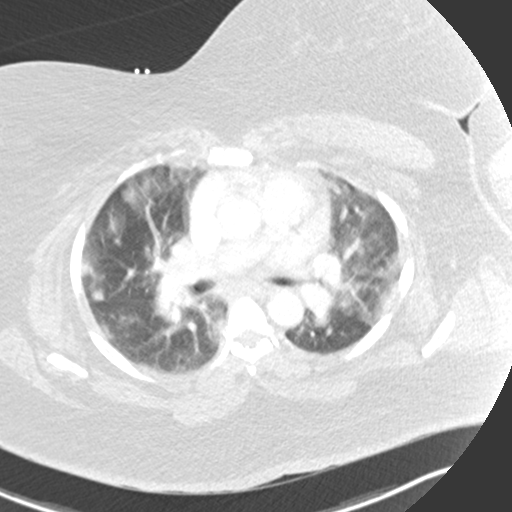
[im 170/272  soft-tissue]
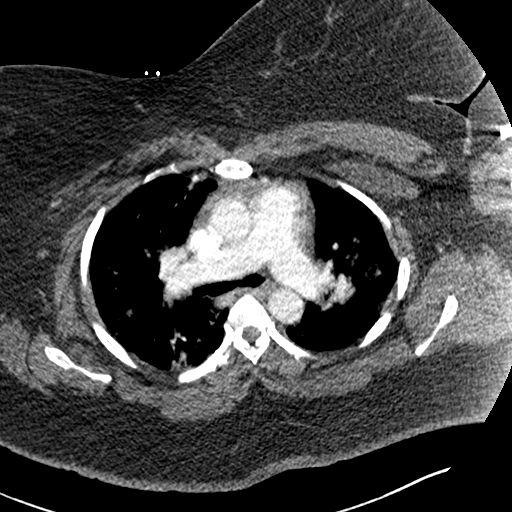
[im 187/272  lung]
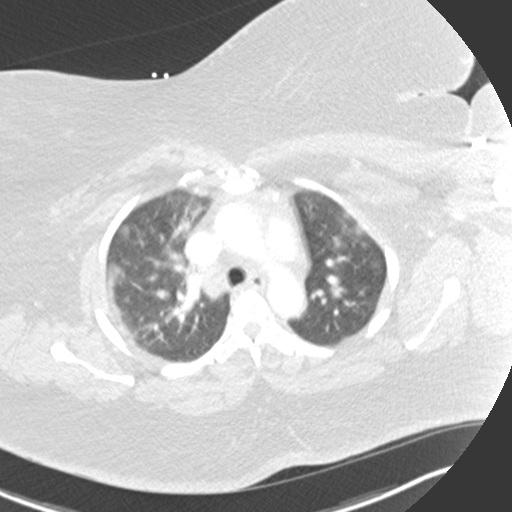
[im 204/272  soft-tissue]
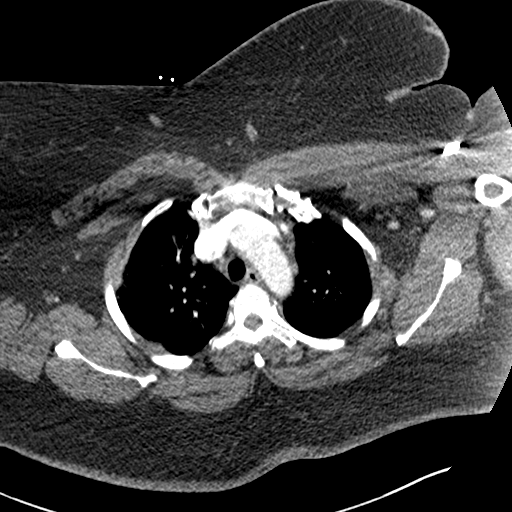
[im 221/272  lung]
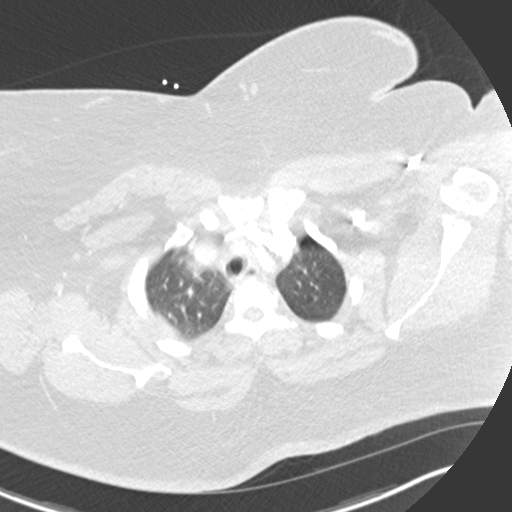
[im 238/272  soft-tissue]
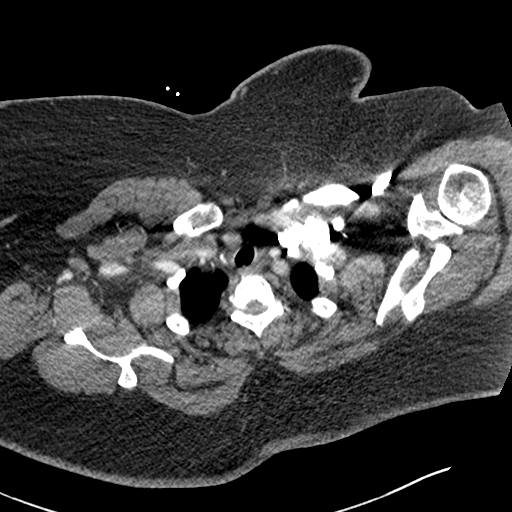
[im 255/272  lung]
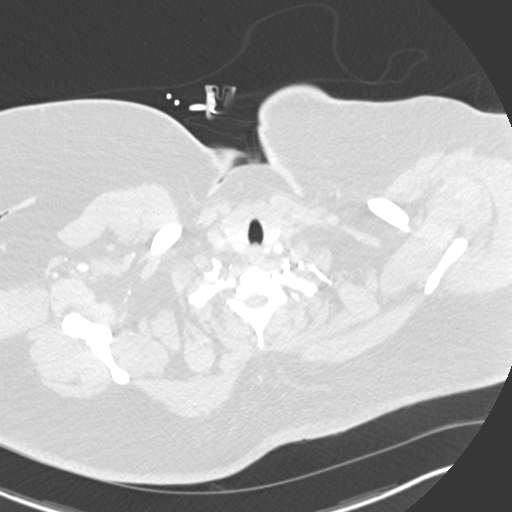

[Series 7: coronal mpr · coronal · 0.53mm/px · 3 of 79 slices shown]
[im 27/79  soft-tissue]
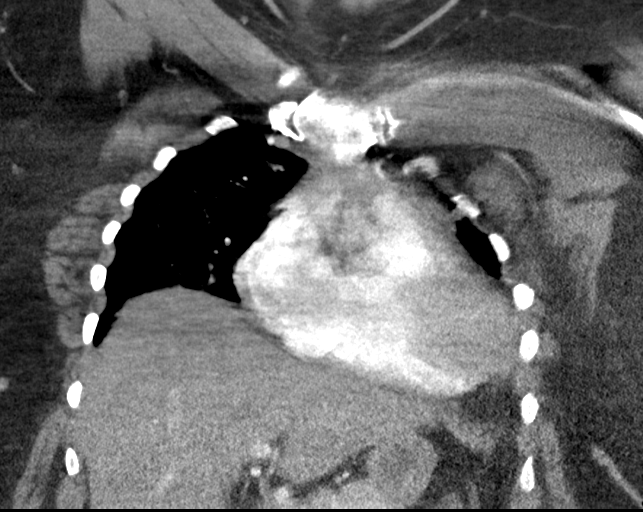
[im 35/79  soft-tissue]
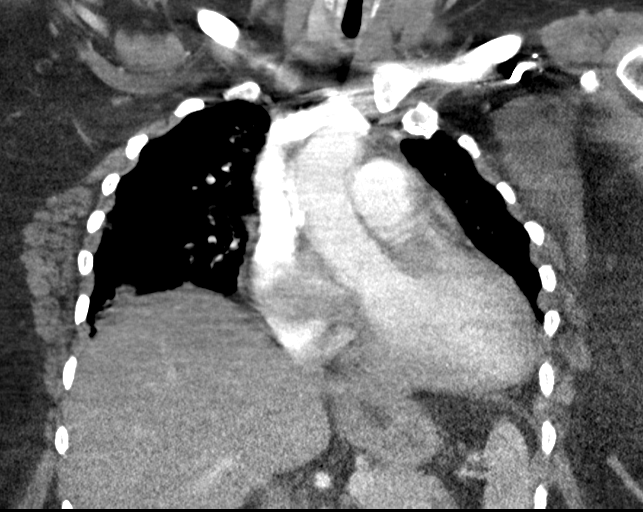
[im 44/79  soft-tissue]
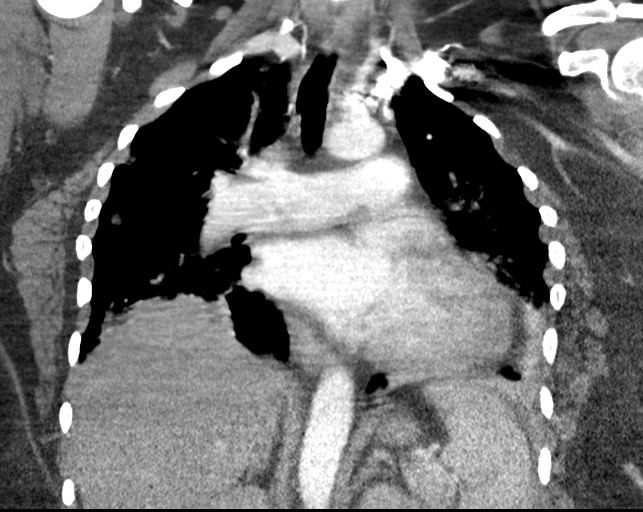

[18 of 46 positions shown; findings below may reference images not displayed]

FINDINGS: Several factors limiting optimal imaging including patient on 15 L
high-flow oxygen as well as positional IV as had to image patient
with arm positioned by side.

Cardiovascular: Mild cardiomegaly. Thoracic aorta is normal in
caliber. Pulmonary arterial system demonstrates no evidence of
pulmonary emboli.

Mediastinum/Nodes: No mediastinal or hilar adenopathy. Remaining
mediastinal structures are normal.

Lungs/Pleura: Lungs are adequately inflated demonstrate patchy
bilateral hazy airspace process compatible with known RFND7-DG
pneumonia. No effusion. Airways are unremarkable.

Upper Abdomen: No acute findings.

Musculoskeletal: No focal abnormality.

Review of the MIP images confirms the above findings.
IMPRESSION: 1. No evidence of pulmonary embolism.
2. Patchy bilateral hazy airspace process compatible with known
RFND7-DG pneumonia.
3. Mild cardiomegaly.
